# Patient Record
Sex: Male | Born: 1960 | Hispanic: Yes | Marital: Married | Smoking: Current every day smoker
Health system: Southern US, Community
[De-identification: ages and names within clinical notes are randomized; demographics above are authoritative.]

## PROBLEM LIST (undated history)

## (undated) DIAGNOSIS — E119 Type 2 diabetes mellitus without complications: Secondary | ICD-10-CM

## (undated) DIAGNOSIS — I10 Essential (primary) hypertension: Secondary | ICD-10-CM

## (undated) DIAGNOSIS — G459 Transient cerebral ischemic attack, unspecified: Secondary | ICD-10-CM

## (undated) DIAGNOSIS — I82409 Acute embolism and thrombosis of unspecified deep veins of unspecified lower extremity: Secondary | ICD-10-CM

## (undated) DIAGNOSIS — N200 Calculus of kidney: Secondary | ICD-10-CM

## (undated) DIAGNOSIS — I2510 Atherosclerotic heart disease of native coronary artery without angina pectoris: Secondary | ICD-10-CM

---

## 2016-12-13 HISTORY — PX: OTHER SURGICAL HISTORY: SHX169

## 2018-07-21 ENCOUNTER — Encounter: Payer: Self-pay | Admitting: Emergency Medicine

## 2018-07-21 ENCOUNTER — Emergency Department: Payer: Medicare Other

## 2018-07-21 ENCOUNTER — Other Ambulatory Visit: Payer: Self-pay

## 2018-07-21 ENCOUNTER — Emergency Department
Admission: EM | Admit: 2018-07-21 | Discharge: 2018-07-21 | Payer: Medicare Other | Attending: Emergency Medicine | Admitting: Emergency Medicine

## 2018-07-21 DIAGNOSIS — R0789 Other chest pain: Secondary | ICD-10-CM | POA: Insufficient documentation

## 2018-07-21 DIAGNOSIS — I10 Essential (primary) hypertension: Secondary | ICD-10-CM | POA: Diagnosis not present

## 2018-07-21 DIAGNOSIS — E119 Type 2 diabetes mellitus without complications: Secondary | ICD-10-CM | POA: Insufficient documentation

## 2018-07-21 DIAGNOSIS — Z8673 Personal history of transient ischemic attack (TIA), and cerebral infarction without residual deficits: Secondary | ICD-10-CM | POA: Insufficient documentation

## 2018-07-21 DIAGNOSIS — R079 Chest pain, unspecified: Secondary | ICD-10-CM | POA: Diagnosis present

## 2018-07-21 DIAGNOSIS — I2510 Atherosclerotic heart disease of native coronary artery without angina pectoris: Secondary | ICD-10-CM | POA: Diagnosis not present

## 2018-07-21 HISTORY — DX: Atherosclerotic heart disease of native coronary artery without angina pectoris: I25.10

## 2018-07-21 HISTORY — DX: Essential (primary) hypertension: I10

## 2018-07-21 HISTORY — DX: Transient cerebral ischemic attack, unspecified: G45.9

## 2018-07-21 HISTORY — DX: Calculus of kidney: N20.0

## 2018-07-21 HISTORY — DX: Type 2 diabetes mellitus without complications: E11.9

## 2018-07-21 LAB — BASIC METABOLIC PANEL
ANION GAP: 7 (ref 5–15)
BUN: 29 mg/dL — ABNORMAL HIGH (ref 6–20)
CALCIUM: 9.3 mg/dL (ref 8.9–10.3)
CO2: 30 mmol/L (ref 22–32)
Chloride: 105 mmol/L (ref 98–111)
Creatinine, Ser: 1.62 mg/dL — ABNORMAL HIGH (ref 0.61–1.24)
GFR, EST AFRICAN AMERICAN: 53 mL/min — AB (ref >60)
GFR, EST NON AFRICAN AMERICAN: 46 mL/min — AB (ref >60)
Glucose, Bld: 113 mg/dL — ABNORMAL HIGH (ref 70–99)
POTASSIUM: 3.5 mmol/L (ref 3.5–5.1)
Sodium: 142 mmol/L (ref 135–145)

## 2018-07-21 LAB — TROPONIN I

## 2018-07-21 LAB — CBC
HEMATOCRIT: 38.7 % — AB (ref 40.0–52.0)
HEMOGLOBIN: 13.2 g/dL (ref 13.0–18.0)
MCH: 28.6 pg (ref 26.0–34.0)
MCHC: 34.1 g/dL (ref 32.0–36.0)
MCV: 84 fL (ref 80.0–100.0)
Platelets: 217 10*3/uL (ref 150–440)
RBC: 4.61 MIL/uL (ref 4.40–5.90)
RDW: 14.1 % (ref 11.5–14.5)
WBC: 11 10*3/uL — ABNORMAL HIGH (ref 3.8–10.6)

## 2018-07-21 NOTE — Discharge Instructions (Addendum)
Please seek medical attention for any high fevers, chest pain, shortness of breath, change in behavior, persistent vomiting, bloody stool or any other new or concerning symptoms.  

## 2018-07-21 NOTE — ED Notes (Signed)
Pt is in police custody. 

## 2018-07-21 NOTE — ED Notes (Signed)
Officer Brooke DareKing with [employer] states they will be able to monitor pt for suicide at jail. Meal tray provided per pt request with consent of PD.

## 2018-07-21 NOTE — ED Notes (Signed)
Glen Peters, ed tech in to room for one on one sitter.

## 2018-07-21 NOTE — ED Notes (Signed)
This rn remains at bedside with pt due to si, awaiting si sitter. Officer king with [employer] at bedside.

## 2018-07-21 NOTE — ED Notes (Signed)
Charge rn notified that pt is suicidal. Pt states given the chance in the treatment room he will kill himself. Than, ed tech to sit one on one with pt.

## 2018-07-21 NOTE — ED Triage Notes (Signed)
Pt with central chest pain since this afternoon. Pt states has had shob, dizziness and diaphoresis. Pt with history of TIA and DVT. Pt had 324mg  asa and one ntg spray with ems.

## 2018-07-21 NOTE — ED Notes (Signed)
Derrill KayGoodman, MD now at bedside

## 2018-07-21 NOTE — ED Provider Notes (Signed)
[facility] Emergency Department Provider Note  ____________________________________________   I have reviewed the triage vital signs and the nursing notes.   HISTORY  Chief Complaint Chest Pain   History limited by: Not Limited   HPI Glen Peters is a 57 y.o. male who presents to the emergency department today because of concerns for chest pain.  He states it was pressure-like.  Located in the center chest.  Had improved at the time my exam.  Patient denies any associated radiation.  Did have some shortness of breath.  Patient denies any history of heart attacks in the past.  States he was diagnosed with a DVT and was started on Xarelto.  States he has been taking the Xarelto.  States he has a history of TIA.   Per medical record review patient has a history of DVT, TIA  Past Medical History:  Diagnosis Date  . Coronary artery disease   . Diabetes mellitus without complication (HCC)   . DVT (deep vein thrombosis) in pregnancy (HCC)   . Hypertension   . Renal calculi   . TIA (transient ischemic attack)     There are no active problems to display for this patient.   History reviewed. No pertinent surgical history.  Prior to Admission medications   Not on File    Allergies Patient has no known allergies.  History reviewed. No pertinent family history.  Social History Social History   Tobacco Use  . Smoking status: Never Smoker  . Smokeless tobacco: Never Used  Substance Use Topics  . Alcohol use: Not Currently    Frequency: Never  . Drug use: Not Currently    Review of Systems Constitutional: No fever/chills Eyes: No visual changes. ENT: No sore throat. Cardiovascular: Positive chest pain. Respiratory: Positive shortness of breath. Gastrointestinal: No abdominal pain.  No nausea, no vomiting.  No diarrhea.   Genitourinary: Negative for dysuria. Musculoskeletal: Negative for back pain. Skin: Negative for rash. Neurological:  Negative for headaches, focal weakness or numbness.  ____________________________________________   PHYSICAL EXAM:  VITAL SIGNS: ED Triage Vitals  Enc Vitals Group     BP 07/21/18 2127 (!) 210/107     Pulse Rate 07/21/18 2127 83     Resp 07/21/18 2127 18     Temp 07/21/18 2127 98.7 F (37.1 C)     Temp Source 07/21/18 2127 Oral     SpO2 07/21/18 2127 98 %     Weight 07/21/18 2128 178 lb (80.7 kg)     Height 07/21/18 2128 5\' 5"  (1.651 m)     Head Circumference --      Peak Flow --      Pain Score 07/21/18 2127 5   Constitutional: Alert and oriented.  Eyes: Conjunctivae are normal.  ENT      Head: Normocephalic and atraumatic.      Nose: No congestion/rhinnorhea.      Mouth/Throat: Mucous membranes are moist.      Neck: No stridor. Hematological/Lymphatic/Immunilogical: No cervical lymphadenopathy. Cardiovascular: Normal rate, regular rhythm.  No murmurs, rubs, or gallops.  Respiratory: Normal respiratory effort without tachypnea nor retractions. Breath sounds are clear and equal bilaterally. No wheezes/rales/rhonchi. Gastrointestinal: Soft and non tender. No rebound. No guarding.  Genitourinary: Deferred Musculoskeletal: Normal range of motion in all extremities. No lower extremity edema. Neurologic:  Normal speech and language. No gross focal neurologic deficits are appreciated.  Skin:  Skin is warm, dry and intact. No rash noted. Psychiatric: Mood and affect are normal. Speech  and behavior are normal. Patient exhibits appropriate insight and judgment.  ____________________________________________    LABS (pertinent positives/negatives)  BMP na 142, k 3.5, glu 113, cr 1.62 CBC wbc 11.0, hgb 13.2 plt 217 Trop <0.03  ____________________________________________   EKG  I, Phineas SemenGraydon Merrel Crabbe, attending physician, personally viewed and interpreted this EKG  EKG Time: 2124 Rate: 89 Rhythm: sinus rhythm with pvc Axis: normal Intervals: qtc 446 QRS: narrow, q waves  III ST changes: no st elevation Impression: abnormal ekg   ____________________________________________    RADIOLOGY  CXR No active disease  ____________________________________________   PROCEDURES  Procedures  ____________________________________________   INITIAL IMPRESSION / ASSESSMENT AND PLAN / ED COURSE  Pertinent labs & imaging results that were available during my care of the patient were reviewed by me and considered in my medical decision making (see chart for details).   Patient scented to the emergency department today because of concerns for chest pain.  EKG without any concerning ST elevation.  Given slightly suspicious history, age and risk factors patient has a heart score of 3.  Troponin was negative.  Will plan on discharging patient.  ____________________________________________   FINAL CLINICAL IMPRESSION(S) / ED DIAGNOSES  Final diagnoses:  Nonspecific chest pain     Note: This dictation was prepared with Dragon dictation. Any transcriptional errors that result from this process are unintentional     Phineas SemenGoodman, Sheila Gervasi, MD 07/21/18 2342

## 2018-08-29 ENCOUNTER — Inpatient Hospital Stay
Admission: EM | Admit: 2018-08-29 | Discharge: 2018-09-01 | DRG: 617 | Payer: Medicare Other | Attending: Internal Medicine | Admitting: Internal Medicine

## 2018-08-29 ENCOUNTER — Emergency Department: Payer: Medicare Other

## 2018-08-29 ENCOUNTER — Other Ambulatory Visit: Payer: Self-pay

## 2018-08-29 ENCOUNTER — Encounter: Payer: Self-pay | Admitting: Emergency Medicine

## 2018-08-29 DIAGNOSIS — I96 Gangrene, not elsewhere classified: Secondary | ICD-10-CM | POA: Diagnosis present

## 2018-08-29 DIAGNOSIS — Z79899 Other long term (current) drug therapy: Secondary | ICD-10-CM

## 2018-08-29 DIAGNOSIS — F329 Major depressive disorder, single episode, unspecified: Secondary | ICD-10-CM | POA: Diagnosis present

## 2018-08-29 DIAGNOSIS — I70262 Atherosclerosis of native arteries of extremities with gangrene, left leg: Secondary | ICD-10-CM | POA: Diagnosis not present

## 2018-08-29 DIAGNOSIS — Z833 Family history of diabetes mellitus: Secondary | ICD-10-CM

## 2018-08-29 DIAGNOSIS — F17210 Nicotine dependence, cigarettes, uncomplicated: Secondary | ICD-10-CM | POA: Diagnosis present

## 2018-08-29 DIAGNOSIS — Z7902 Long term (current) use of antithrombotics/antiplatelets: Secondary | ICD-10-CM | POA: Diagnosis not present

## 2018-08-29 DIAGNOSIS — I2510 Atherosclerotic heart disease of native coronary artery without angina pectoris: Secondary | ICD-10-CM | POA: Diagnosis present

## 2018-08-29 DIAGNOSIS — E1140 Type 2 diabetes mellitus with diabetic neuropathy, unspecified: Secondary | ICD-10-CM | POA: Diagnosis present

## 2018-08-29 DIAGNOSIS — K219 Gastro-esophageal reflux disease without esophagitis: Secondary | ICD-10-CM | POA: Diagnosis present

## 2018-08-29 DIAGNOSIS — I10 Essential (primary) hypertension: Secondary | ICD-10-CM | POA: Diagnosis present

## 2018-08-29 DIAGNOSIS — Z86718 Personal history of other venous thrombosis and embolism: Secondary | ICD-10-CM | POA: Diagnosis not present

## 2018-08-29 DIAGNOSIS — Z8249 Family history of ischemic heart disease and other diseases of the circulatory system: Secondary | ICD-10-CM | POA: Diagnosis not present

## 2018-08-29 DIAGNOSIS — Z7984 Long term (current) use of oral hypoglycemic drugs: Secondary | ICD-10-CM | POA: Diagnosis not present

## 2018-08-29 DIAGNOSIS — Z8673 Personal history of transient ischemic attack (TIA), and cerebral infarction without residual deficits: Secondary | ICD-10-CM | POA: Diagnosis not present

## 2018-08-29 DIAGNOSIS — Z87442 Personal history of urinary calculi: Secondary | ICD-10-CM | POA: Diagnosis not present

## 2018-08-29 DIAGNOSIS — G4730 Sleep apnea, unspecified: Secondary | ICD-10-CM | POA: Diagnosis present

## 2018-08-29 DIAGNOSIS — E1169 Type 2 diabetes mellitus with other specified complication: Secondary | ICD-10-CM | POA: Diagnosis present

## 2018-08-29 DIAGNOSIS — M86171 Other acute osteomyelitis, right ankle and foot: Secondary | ICD-10-CM | POA: Diagnosis present

## 2018-08-29 DIAGNOSIS — Z23 Encounter for immunization: Secondary | ICD-10-CM

## 2018-08-29 DIAGNOSIS — E1152 Type 2 diabetes mellitus with diabetic peripheral angiopathy with gangrene: Secondary | ICD-10-CM | POA: Diagnosis present

## 2018-08-29 DIAGNOSIS — O2230 Deep phlebothrombosis in pregnancy, unspecified trimester: Secondary | ICD-10-CM | POA: Insufficient documentation

## 2018-08-29 DIAGNOSIS — E119 Type 2 diabetes mellitus without complications: Secondary | ICD-10-CM | POA: Diagnosis not present

## 2018-08-29 HISTORY — DX: Acute embolism and thrombosis of unspecified deep veins of unspecified lower extremity: I82.409

## 2018-08-29 LAB — CBC WITH DIFFERENTIAL/PLATELET
BASOS ABS: 0 10*3/uL (ref 0–0.1)
BASOS PCT: 1 %
EOS ABS: 0.1 10*3/uL (ref 0–0.7)
Eosinophils Relative: 1 %
HEMATOCRIT: 33.5 % — AB (ref 40.0–52.0)
HEMOGLOBIN: 11.9 g/dL — AB (ref 13.0–18.0)
Lymphocytes Relative: 18 %
Lymphs Abs: 1.5 10*3/uL (ref 1.0–3.6)
MCH: 29.1 pg (ref 26.0–34.0)
MCHC: 35.4 g/dL (ref 32.0–36.0)
MCV: 82.3 fL (ref 80.0–100.0)
MONO ABS: 0.9 10*3/uL (ref 0.2–1.0)
Monocytes Relative: 10 %
NEUTROS ABS: 5.9 10*3/uL (ref 1.4–6.5)
NEUTROS PCT: 70 %
Platelets: 257 10*3/uL (ref 150–440)
RBC: 4.07 MIL/uL — ABNORMAL LOW (ref 4.40–5.90)
RDW: 13.9 % (ref 11.5–14.5)
WBC: 8.4 10*3/uL (ref 3.8–10.6)

## 2018-08-29 LAB — BASIC METABOLIC PANEL
ANION GAP: 8 (ref 5–15)
BUN: 33 mg/dL — ABNORMAL HIGH (ref 6–20)
CALCIUM: 9 mg/dL (ref 8.9–10.3)
CO2: 30 mmol/L (ref 22–32)
CREATININE: 1.43 mg/dL — AB (ref 0.61–1.24)
Chloride: 102 mmol/L (ref 98–111)
GFR calc non Af Amer: 53 mL/min — ABNORMAL LOW (ref >60)
Glucose, Bld: 92 mg/dL (ref 70–99)
Potassium: 4 mmol/L (ref 3.5–5.1)
SODIUM: 140 mmol/L (ref 135–145)

## 2018-08-29 LAB — GLUCOSE, CAPILLARY
GLUCOSE-CAPILLARY: 200 mg/dL — AB (ref 70–99)
GLUCOSE-CAPILLARY: 117 mg/dL — AB (ref 70–99)

## 2018-08-29 LAB — MRSA PCR SCREENING: MRSA by PCR: NEGATIVE

## 2018-08-29 MED ORDER — CARVEDILOL 12.5 MG PO TABS
12.5000 mg | ORAL_TABLET | Freq: Two times a day (BID) | ORAL | Status: DC
  Administered 2018-08-29 – 2018-09-01 (×6): 12.5 mg via ORAL
  Filled 2018-08-29 (×7): qty 1

## 2018-08-29 MED ORDER — VANCOMYCIN HCL IN DEXTROSE 1-5 GM/200ML-% IV SOLN
1000.0000 mg | Freq: Once | INTRAVENOUS | Status: AC
  Administered 2018-08-29: 1000 mg via INTRAVENOUS
  Filled 2018-08-29: qty 200

## 2018-08-29 MED ORDER — ONDANSETRON HCL 4 MG/2ML IJ SOLN: 4.0000 mg | Freq: Four times a day (QID) | INTRAMUSCULAR | Status: DC | PRN

## 2018-08-29 MED ORDER — PIPERACILLIN-TAZOBACTAM 3.375 G IVPB 30 MIN
3.3750 g | Freq: Once | INTRAVENOUS | Status: AC
  Administered 2018-08-29: 3.375 g via INTRAVENOUS
  Filled 2018-08-29: qty 50

## 2018-08-29 MED ORDER — ENOXAPARIN SODIUM 40 MG/0.4ML ~~LOC~~ SOLN
40.0000 mg | SUBCUTANEOUS | Status: DC
  Administered 2018-08-29 – 2018-08-31 (×3): 40 mg via SUBCUTANEOUS
  Filled 2018-08-29 (×3): qty 0.4

## 2018-08-29 MED ORDER — LOSARTAN POTASSIUM-HCTZ 100-25 MG PO TABS: 1.0000 | ORAL_TABLET | Freq: Every day | ORAL | Status: DC

## 2018-08-29 MED ORDER — VANCOMYCIN 50 MG/ML ORAL SOLUTION
125.0000 mg | Freq: Four times a day (QID) | ORAL | Status: DC
  Filled 2018-08-29: qty 2.5

## 2018-08-29 MED ORDER — PNEUMOCOCCAL VAC POLYVALENT 25 MCG/0.5ML IJ INJ
0.5000 mL | INJECTION | INTRAMUSCULAR | Status: AC
  Administered 2018-08-30: 0.5 mL via INTRAMUSCULAR
  Filled 2018-08-29: qty 0.5

## 2018-08-29 MED ORDER — AMLODIPINE BESYLATE 10 MG PO TABS
10.0000 mg | ORAL_TABLET | Freq: Every day | ORAL | Status: DC
  Administered 2018-08-29 – 2018-09-01 (×4): 10 mg via ORAL
  Filled 2018-08-29 (×4): qty 1

## 2018-08-29 MED ORDER — ACETAMINOPHEN 650 MG RE SUPP: 650.0000 mg | Freq: Four times a day (QID) | RECTAL | Status: DC | PRN

## 2018-08-29 MED ORDER — INSULIN ASPART 100 UNIT/ML ~~LOC~~ SOLN: 0.0000 [IU] | Freq: Every day | SUBCUTANEOUS | Status: DC

## 2018-08-29 MED ORDER — ONDANSETRON HCL 4 MG PO TABS: 4.0000 mg | ORAL_TABLET | Freq: Four times a day (QID) | ORAL | Status: DC | PRN

## 2018-08-29 MED ORDER — LOSARTAN POTASSIUM 50 MG PO TABS
100.0000 mg | ORAL_TABLET | Freq: Every day | ORAL | Status: DC
  Administered 2018-08-29 – 2018-09-01 (×4): 100 mg via ORAL
  Filled 2018-08-29 (×4): qty 2

## 2018-08-29 MED ORDER — PANTOPRAZOLE SODIUM 40 MG PO TBEC
40.0000 mg | DELAYED_RELEASE_TABLET | Freq: Every day | ORAL | Status: DC
  Administered 2018-08-29 – 2018-09-01 (×4): 40 mg via ORAL
  Filled 2018-08-29 (×4): qty 1

## 2018-08-29 MED ORDER — SERTRALINE HCL 50 MG PO TABS
150.0000 mg | ORAL_TABLET | Freq: Every day | ORAL | Status: DC
  Administered 2018-08-29: 150 mg via ORAL
  Filled 2018-08-29 (×2): qty 3

## 2018-08-29 MED ORDER — INSULIN ASPART 100 UNIT/ML ~~LOC~~ SOLN
0.0000 [IU] | Freq: Three times a day (TID) | SUBCUTANEOUS | Status: DC
  Administered 2018-08-29: 2 [IU] via SUBCUTANEOUS
  Administered 2018-08-30 (×2): 1 [IU] via SUBCUTANEOUS
  Administered 2018-08-30: 2 [IU] via SUBCUTANEOUS
  Administered 2018-08-31: 1 [IU] via SUBCUTANEOUS
  Administered 2018-09-01: 2 [IU] via SUBCUTANEOUS
  Filled 2018-08-29 (×6): qty 1

## 2018-08-29 MED ORDER — PIPERACILLIN-TAZOBACTAM 3.375 G IVPB
3.3750 g | Freq: Three times a day (TID) | INTRAVENOUS | Status: DC
  Administered 2018-08-29 – 2018-09-01 (×8): 3.375 g via INTRAVENOUS
  Filled 2018-08-29 (×7): qty 50

## 2018-08-29 MED ORDER — INFLUENZA VAC SPLIT QUAD 0.5 ML IM SUSY
0.5000 mL | PREFILLED_SYRINGE | INTRAMUSCULAR | Status: AC
  Administered 2018-08-30: 0.5 mL via INTRAMUSCULAR
  Filled 2018-08-29: qty 0.5

## 2018-08-29 MED ORDER — ACETAMINOPHEN 325 MG PO TABS
650.0000 mg | ORAL_TABLET | Freq: Four times a day (QID) | ORAL | Status: DC | PRN
  Administered 2018-08-29 – 2018-08-31 (×5): 650 mg via ORAL
  Filled 2018-08-29 (×5): qty 2

## 2018-08-29 MED ORDER — HYDROCHLOROTHIAZIDE 25 MG PO TABS
25.0000 mg | ORAL_TABLET | Freq: Every day | ORAL | Status: DC
  Administered 2018-08-29 – 2018-09-01 (×4): 25 mg via ORAL
  Filled 2018-08-29 (×4): qty 1

## 2018-08-29 MED ORDER — GLIPIZIDE 5 MG PO TABS
5.0000 mg | ORAL_TABLET | Freq: Two times a day (BID) | ORAL | Status: DC
  Administered 2018-08-30 – 2018-09-01 (×5): 5 mg via ORAL
  Filled 2018-08-29 (×7): qty 1

## 2018-08-29 MED ORDER — VANCOMYCIN HCL 10 G IV SOLR
1250.0000 mg | INTRAVENOUS | Status: DC
  Administered 2018-08-29 – 2018-09-01 (×4): 1250 mg via INTRAVENOUS
  Filled 2018-08-29 (×5): qty 1250

## 2018-08-29 NOTE — Progress Notes (Signed)
Rounding on ED Pt was referred by clerk. Pt is in custody of [employer]. Chaplain explained to Pt that he will have to get the AD done through the procedures of the Correctional system. AD has staples in it therefore it was presented to the sheriff who was present. We need to get clarification as to the protocol for Chaplains for Pt in custody.    08/29/18 1100  Clinical Encounter Type  Visited With Patient  Visit Type Initial  Referral From Nurse  Spiritual Encounters  Spiritual Needs Brochure

## 2018-08-29 NOTE — ED Notes (Signed)
Toe dressed per MD request. Pt tolerated well. Pt aware of POC and denies needs/complaints. Officer at bedside.

## 2018-08-29 NOTE — ED Notes (Signed)
XR in room 

## 2018-08-29 NOTE — ED Notes (Signed)
Pt in US. Officer accompanied pt to US.

## 2018-08-29 NOTE — Progress Notes (Signed)
Pt arrived to room 157 from ED. Pt alert and orientedX4. Pt denies pain at this time. Pt ambulatory to the bathroom. Officer at bedside. MRSA swab obtained. Call bell within reach. No other questions or complaints at this time.

## 2018-08-29 NOTE — ED Notes (Signed)
Returned from US, [place] in room to discuss creating advanced directive.

## 2018-08-29 NOTE — H&P (Signed)
[employer] -  at [facility]   PATIENT NAME: Glen Peters    MR#:  [id number]  DATE OF BIRTH:  08/15/1961  DATE OF ADMISSION:  08/29/2018  [place] [facility]   REQUESTING/REFERRING PHYSICIAN: Dr. Governor Rooksebecca Lord  CHIEF COMPLAINT:   Chief Complaint  Patient presents with  . Toe Injury  . Toe Pain  . Wound Infection    HISTORY OF PRESENT ILLNESS:  Glen Peters  is a 57 y.o. male with a known history of diabetes, coronary artery disease, previous history of DVT, renal stones, hypertension, previous history of TIAs, history of severe obstructive sleep apnea status post hypoglossal nerve stimulator who presents to the hospital due to a toe that appears necrotic and has some foul-smelling drainage.  Patient apparently is a inmate [facility] and over the past few days to a week he has noticed that his right third toe has been looking different and has been bleeding intermittently with some foul smelling odor.  He was brought to the ER for further evaluation noted to have a necrotic right third toe.  Hospitalist services were contacted for admission.  Patient denies any significant pain to the toe, he denies any fevers chills nausea vomiting or any other associated symptoms presently.  Patient underwent x-ray of his right foot which shows no evidence of osteomyelitis other than some soft tissue inflammation.  PAST MEDICAL HISTORY:   Past Medical History:  Diagnosis Date  . Coronary artery disease   . Diabetes mellitus without complication (HCC)   . DVT (deep venous thrombosis) (HCC)   . Hypertension   . Renal calculi   . TIA (transient ischemic attack)     PAST SURGICAL HISTORY:  History reviewed. No pertinent surgical history.  SOCIAL HISTORY:   Social History   Tobacco Use  . Smoking status: Current Every Day Smoker    Packs/day: 1.00    Years: 5.00    Pack years: 5.00    Types: Cigarettes  . Smokeless  tobacco: Never Used  Substance Use Topics  . Alcohol use: Yes    Frequency: Never    Comment: Socially    FAMILY HISTORY:   Family History  Problem Relation Age of Onset  . Diabetes Mother   . Hypertension Mother   . Diabetes Father   . Hypertension Father     DRUG ALLERGIES:  No Known Allergies  REVIEW OF SYSTEMS:   Review of Systems  Constitutional: Negative for fever and weight loss.  HENT: Negative for congestion, nosebleeds and tinnitus.   Eyes: Negative for blurred vision, double vision and redness.  Respiratory: Negative for cough, hemoptysis and shortness of breath.   Cardiovascular: Negative for chest pain, orthopnea, leg swelling and PND.  Gastrointestinal: Negative for abdominal pain, diarrhea, melena, nausea and vomiting.  Genitourinary: Negative for dysuria, hematuria and urgency.  Musculoskeletal: Negative for falls and joint pain.  Neurological: Negative for dizziness, tingling, sensory change, focal weakness, seizures, weakness and headaches.  Endo/Heme/Allergies: Negative for polydipsia. Does not bruise/bleed easily.  Psychiatric/Behavioral: Negative for depression and memory loss. The patient is not nervous/anxious.     MEDICATIONS AT HOME:   Prior to Admission medications   Medication Sig Start Date End Date Taking? Authorizing Provider  acetaminophen (TYLENOL) 500 MG tablet Take 1,000 mg by mouth 2 (two) times daily. For 7 days 08/25/18 08/31/18 Yes [provider]  amLODipine (NORVASC) 10 MG tablet Take 10 mg by mouth daily.   Yes [provider]  carvedilol (COREG) 12.5 MG tablet Take 12.5 mg by mouth 2 (two) times daily.   Yes [provider]  ciprofloxacin (CIPRO) 500 MG tablet Take 500 mg by mouth 2 (two) times daily. For 20 days 08/13/18 09/01/18 Yes [provider]  clopidogrel (PLAVIX) 75 MG tablet Take 75 mg by mouth daily.   Yes [provider]  glipiZIDE (GLUCOTROL) 5 MG tablet Take 5 mg by mouth 2  (two) times daily.   Yes [provider]  losartan-hydrochlorothiazide (HYZAAR) 100-25 MG tablet Take 1 tablet by mouth daily.   Yes [provider]  omeprazole (PRILOSEC) 20 MG capsule Take 20 mg by mouth daily.   Yes [provider]  sertraline (ZOLOFT) 50 MG tablet Take 150 mg by mouth daily.   Yes [provider]      VITAL SIGNS:  Blood pressure 125/74, pulse 75, temperature 98.5 F (36.9 C), temperature source Oral, resp. rate 15, height 5\' 5"  (1.651 m), weight 76.2 kg, SpO2 98 %.  PHYSICAL EXAMINATION:  Physical Exam  GENERAL:  57 y.o.-year-old patient lying in the bed with no acute distress.  EYES: Pupils equal, round, reactive to light and accommodation. No scleral icterus. Extraocular muscles intact.  HEENT: Head atraumatic, normocephalic. Oropharynx and nasopharynx clear. No oropharyngeal erythema, moist oral mucosa  NECK:  Supple, no jugular venous distention. No thyroid enlargement, no tenderness.  LUNGS: Normal breath sounds bilaterally, no wheezing, rales, rhonchi. No use of accessory muscles of respiration.  CARDIOVASCULAR: S1, S2 RRR. No murmurs, rubs, gallops, clicks.  ABDOMEN: Soft, nontender, nondistended. Bowel sounds present. No organomegaly or mass.  EXTREMITIES: No pedal edema, cyanosis, or clubbing. + 2 pedal & radial pulses b/l.   NEUROLOGIC: Cranial nerves II through XII are intact. No focal Motor or sensory deficits appreciated b/l PSYCHIATRIC: The patient is alert and oriented x 3. Good affect.  SKIN: No obvious rash, lesion, or ulcer.  Necrotic right third toe.  LABORATORY PANEL:   CBC Recent Labs  Lab 08/29/18 1112  WBC 8.4  HGB 11.9*  HCT 33.5*  PLT 257   ------------------------------------------------------------------------------------------------------------------  Chemistries  Recent Labs  Lab 08/29/18 1112  NA 140  K 4.0  CL 102  CO2 30  GLUCOSE 92  BUN 33*  CREATININE 1.43*  CALCIUM 9.0    ------------------------------------------------------------------------------------------------------------------  Cardiac Enzymes No results for input(s): TROPONINI in the last 168 hours. ------------------------------------------------------------------------------------------------------------------  RADIOLOGY:  US Venous Img Lower Unilateral Right  Result Date: 08/29/2018 CLINICAL DATA:  57 year old male with right toe wound for the past 2 weeks. Evaluate for underlying DVT. EXAM: RIGHT LOWER EXTREMITY VENOUS DOPPLER ULTRASOUND TECHNIQUE: Gray-scale sonography with graded compression, as well as color Doppler and duplex ultrasound were performed to evaluate the lower extremity deep venous systems from the level of the common femoral vein and including the common femoral, femoral, profunda femoral, popliteal and calf veins including the posterior tibial, peroneal and gastrocnemius veins when visible. The superficial great saphenous vein was also interrogated. Spectral Doppler was utilized to evaluate flow at rest and with distal augmentation maneuvers in the common femoral, femoral and popliteal veins. COMPARISON:  None. FINDINGS: Contralateral Common Femoral Vein: Respiratory phasicity is normal and symmetric with the symptomatic side. No evidence of thrombus. Normal compressibility. Common Femoral Vein: No evidence of thrombus. Normal compressibility, respiratory phasicity and response to augmentation. Saphenofemoral Junction: No evidence of thrombus. Normal compressibility and flow on color Doppler imaging. Profunda Femoral Vein: No evidence of thrombus. Normal compressibility and flow  on color Doppler imaging. Femoral Vein: No evidence of thrombus. Normal compressibility, respiratory phasicity and response to augmentation. Popliteal Vein: No evidence of thrombus. Normal compressibility, respiratory phasicity and response to augmentation. Calf Veins: No evidence of thrombus. Normal  compressibility and flow on color Doppler imaging. Superficial Great Saphenous Vein: No evidence of thrombus. Normal compressibility. Venous Reflux:  None. Other Findings:  None. IMPRESSION: No evidence of deep venous thrombosis. Electronically Signed   By: Malachy Moan M.D.   On: 08/29/2018 11:13   Dg Toe 3rd Right  Result Date: 08/29/2018 CLINICAL DATA:  Injured third toe of the right foot. EXAM: RIGHT THIRD TOE COMPARISON:  None. FINDINGS: There is no evidence of fracture or dislocation. There is no evidence of arthropathy or other focal bone abnormality. There is a soft tissue wound of the third digit. There is no periosteal reaction or bone destruction. IMPRESSION: Soft tissue wound of the third digit. No radiographic evidence of osteomyelitis of the third toe. Electronically Signed   By: Elige Ko   On: 08/29/2018 11:00     IMPRESSION AND PLAN:   57 year old male with past medical history of coronary artery disease, previous TIA, diabetes, hypertension, history of severe obstructive sleep apnea status post hypoglossal nerve stimulator, who presents to the hospital due to a necrotic appearing right third toe with some foul-smelling drainage.  1.  Necrotic right third toe- patient's x-ray shows no evidence of we will admit the patient to the hospital start him on broad-spectrum IV antibiotics vancomycin, Zosyn.  We will get a podiatry consult, discussed the case with Dr. Orland Jarred.  2.  Diabetes type 2 with neuropathy- continue glipizide, will order some sliding scale insulin. -Check hemoglobin A 1C.  3.  Essential hypertension-continue carvedilol, Norvasc, losartan/HCTZ.  4.  GERD-continue Protonix.  5.  Depression-continue Zoloft.    All the records are reviewed and case discussed with ED provider. Management plans discussed with the patient, family and they are in agreement.  CODE STATUS: Full code  TOTAL TIME TAKING CARE OF THIS PATIENT: 40 minutes.    Houston Siren  M.D on 08/29/2018 at 1:17 PM  Between 7am to 6pm - Pager - [phone]  After 6pm go to [email or web address] - password [email or web address] [facility]  [facility]  Office  [phone]  CC: Primary care physician; [place]

## 2018-08-29 NOTE — Progress Notes (Addendum)
Pharmacy Antibiotic Note  Glen SmolderJose Peters is a 57 y.o. male admitted on 08/29/2018 with wound infection.  Pharmacy has been consulted for vancomycin and Zosyn dosing. Pt received Zosyn 3.375 g IV x1 and vancomycin 1g IV x1 in the ED.   Plan: Zosyn 3.375g IV q8h (4 hour infusion).   Vancomycin 1250 mg IV q18h with stacked dosing. Will need to follow renal function closely. Vancomycin trough before 4th dose of regimen.   Ke 0.046, half life 15 h, Vd 53.3 Peters  Height: 5\' 5"  (165.1 cm) Weight: 168 lb (76.2 kg) IBW/kg (Calculated) : 61.5  Temp (24hrs), Avg:98.5 F (36.9 C), Min:98.5 F (36.9 C), Max:98.5 F (36.9 C)  Recent Labs  Lab 08/29/18 1112  WBC 8.4  CREATININE 1.43*    Estimated Creatinine Clearance: 55 mL/min (A) (by C-G formula based on SCr of 1.43 mg/dL (H)).    No Known Allergies  Antimicrobials this admission: Vanc/Zosyn 9/17>>  Dose adjustments this admission:   Microbiology results:    Thank you for allowing pharmacy to be a part of this patient's care.  Glen Peters, Glen Peters 08/29/2018 3:29 PM

## 2018-08-29 NOTE — ED Notes (Signed)
ED Provider at bedside. 

## 2018-08-29 NOTE — ED Provider Notes (Signed)
[facility] Emergency Department Provider Note ____________________________________________   I have reviewed the triage vital signs and the triage nursing note.  HISTORY  Chief Complaint Toe Injury; Toe Pain; and Wound Infection   Historian Patient With [place], from jail  HPI Glen Peters is a 57 y.o. male presenting from jail with complaint of toe infection.  He has a black third toe, for it sounds like over a week.  Reports some swelling to the entire right lower extremity although it is not clear how long that is been there.   He had a history in the past of a DVT in the right lower extremity that he took 6 months of Xarelto, and is currently off of that now.  Mild pain.  States that he had some nausea and vomiting over a week ago, that is better.  States that he has night sweats.    Past Medical History:  Diagnosis Date  . Coronary artery disease   . Diabetes mellitus without complication (HCC)   . DVT (deep venous thrombosis) (HCC)   . Hypertension   . Renal calculi   . TIA (transient ischemic attack)     Patient Active Problem List   Diagnosis Date Noted  . Necrotic toes (HCC) 08/29/2018  . DVT (deep vein thrombosis) in pregnancy [facility])     History reviewed. No pertinent surgical history.  Prior to Admission medications   Medication Sig Start Date End Date Taking? Authorizing Provider  acetaminophen (TYLENOL) 500 MG tablet Take 1,000 mg by mouth 2 (two) times daily. For 7 days 08/25/18 08/31/18 Yes [provider]  amLODipine (NORVASC) 10 MG tablet Take 10 mg by mouth daily.   Yes [provider]  carvedilol (COREG) 12.5 MG tablet Take 12.5 mg by mouth 2 (two) times daily.   Yes [provider]  ciprofloxacin (CIPRO) 500 MG tablet Take 500 mg by mouth 2 (two) times daily. For 20 days 08/13/18 09/01/18 Yes [provider]  clopidogrel (PLAVIX) 75 MG tablet Take 75 mg by mouth daily.   Yes [provider]  glipiZIDE (GLUCOTROL) 5 MG tablet Take 5 mg by mouth 2 (two) times daily.   Yes [provider]  losartan-hydrochlorothiazide (HYZAAR) 100-25 MG tablet Take 1 tablet by mouth daily.   Yes [provider]  omeprazole (PRILOSEC) 20 MG capsule Take 20 mg by mouth daily.   Yes [provider]  sertraline (ZOLOFT) 50 MG tablet Take 150 mg by mouth daily.   Yes [provider]    No Known Allergies  Family History  Problem Relation Age of Onset  . Diabetes Mother   . Hypertension Mother   . Diabetes Father   . Hypertension Father     Social History Social History   Tobacco Use  . Smoking status: Current Every Day Smoker    Packs/day: 1.00    Years: 5.00    Pack years: 5.00    Types: Cigarettes  . Smokeless tobacco: Never Used  Substance Use Topics  . Alcohol use: Yes    Frequency: Never    Comment: Socially  . Drug use: Not Currently    Review of Systems  Constitutional: Negative for fever. Eyes: Negative for visual changes. ENT: Negative for sore throat. Cardiovascular: Negative for chest pain. Respiratory: Negative for shortness of breath. Gastrointestinal: Negative for abdominal pain, vomiting and diarrhea. Genitourinary: Negative for dysuria. Musculoskeletal: Negative for back pain. Skin: Necrotic right third toe. Neurological: Negative for headache.  ____________________________________________  PHYSICAL EXAM:  VITAL SIGNS: ED Triage Vitals  Enc Vitals Group     BP 08/29/18 0959 129/76     Pulse Rate 08/29/18 0959 77     Resp 08/29/18 0959 20     Temp 08/29/18 0959 98.5 F (36.9 C)     Temp Source 08/29/18 0959 Oral     SpO2 08/29/18 0959 98 %     Weight 08/29/18 1000 168 lb (76.2 kg)     Height 08/29/18 1000 5\' 5"  (1.651 m)     Head Circumference --      Peak Flow --      Pain Score 08/29/18 1000 9     Pain Loc --      Pain Edu? --      Excl. in GC? --      Constitutional: Alert and oriented.   HEENT      Head: Normocephalic and atraumatic.      Eyes: Conjunctivae are normal. Pupils equal and round.       Ears:         Nose: No congestion/rhinnorhea.      Mouth/Throat: Mucous membranes are moist.      Neck: No stridor. Cardiovascular/Chest: Normal rate, regular rhythm.  No murmurs, rubs, or gallops. Respiratory: Normal respiratory effort without tachypnea nor retractions. Breath sounds are clear and equal bilaterally. No wheezes/rales/rhonchi. Gastrointestinal: Soft. No distention, no guarding, no rebound. Nontender.    Genitourinary/rectal:Deferred Musculoskeletal: Nontender with normal range of motion in all extremities. No joint effusions.  Right lower extremity 1+ pitting edema.  Left lower extremity no edema.  Right third toe with an area of necrosis as well as an area of white drainage and erythema.  This area does not seem to extend past the toe onto the foot. Neurologic:  Normal speech and language. No gross or focal neurologic deficits are appreciated. Skin:  Skin is warm, dry and intact. No rash noted. Psychiatric: Mood and affect are normal. Speech and behavior are normal. Patient exhibits appropriate insight and judgment.   ____________________________________________  LABS (pertinent positives/negatives) I, Governor Rooks, MD the attending physician have reviewed the labs noted below.  Labs Reviewed  CBC WITH DIFFERENTIAL/PLATELET - Abnormal; Notable for the following components:      Result Value   RBC 4.07 (*)    Hemoglobin 11.9 (*)    HCT 33.5 (*)    All other components within normal limits  BASIC METABOLIC PANEL - Abnormal; Notable for the following components:   BUN 33 (*)    Creatinine, Ser 1.43 (*)    GFR calc non Af Amer 53 (*)    All other components within normal limits  HEMOGLOBIN A1C    ____________________________________________    EKG I, Governor Rooks, MD, the attending physician have personally viewed and interpreted all  ECGs.  None ____________________________________________  RADIOLOGY   X-ray right third toe viewed by me and I reviewed the radiologist report: No underlying fracture or signs of osteo-myelitis  Ultrasound right lower extremity reviewed radiology report: No DVT __________________________________________  PROCEDURES  Procedure(s) performed: None  Procedures  Critical Care performed: None   ____________________________________________  ED COURSE / ASSESSMENT AND PLAN  Pertinent labs & imaging results that were available during my care of the patient were reviewed by me and considered in my medical decision making (see chart for details).     Patient has a gangrenous toe.  He is a diabetic so likely vascular risk factor there.  Given swelling to the  right lower extremity, ultrasound was obtained to rule out DVT and there is no DVT.  Patient does not appear to be septic clinically, but will send labs for eval of electrolytes and kidney function and white blood cell count.  X-ray does not show signs of underlying osteomyelitis, however there is a necrotic area of skin with a surrounding area that looks infected and gangrenous, patient will need likely surgical debridement.  Will initiate IV antibiotics.  Patient given Zosyn and vancomycin.  We will admit to the hospitalist.    CONSULTATIONS:   Hospitalist for admission.   Patient / Family / Caregiver informed of clinical course, medical decision-making process, and agree with plan.    ___________________________________________   FINAL CLINICAL IMPRESSION(S) / ED DIAGNOSES   Final diagnoses:  Gangrene of toe (HCC)      ___________________________________________         Note: This dictation was prepared with Dragon dictation. Any transcriptional errors that result from this process are unintentional    Governor RooksLord, Azariah Bonura, MD 08/29/18 1336

## 2018-08-29 NOTE — ED Notes (Addendum)
Pt reports problem with toe (right middle toe) for the past 2 weeks, pt has hx of diabetes and PAD. Pt states he is not on insulin currently while in the jail. Pt states he also has neuropathy and is unable to feel if any problems in his feet.   Necrotic area noted to middle toe on the right, no drainage noted at this time. Officer at bedside. Pt in bilateral wrist and ankle shackles.

## 2018-08-29 NOTE — Consult Note (Signed)
[facility] Podiatry                                                      Patient Demographics  Glen Peters, is a 57 y.o. male   MRN: [id number]   DOB - [birth year]  Admit Date - 08/29/2018    Outpatient Primary MD for the patient is Kendall West, [place], MD  Consult requested in the Hospital by Houston Siren, MD, On 08/29/2018    Reason for consult patient admitted for necrotic changes third toe right foot.  He is a incarcerated individual at the [facility] jail and states that the toe has gotten worse over the last week.   With History of -  Past Medical History:  Diagnosis Date  . Coronary artery disease   . Diabetes mellitus without complication (HCC)   . DVT (deep venous thrombosis) (HCC)   . Hypertension   . Renal calculi   . TIA (transient ischemic attack)       History reviewed. No pertinent surgical history.  in for   Chief Complaint  Patient presents with  . Toe Injury  . Toe Pain  . Wound Infection     HPI  Glen Peters  is a 57 y.o. male, patient is diabetic states he had an angioplasty done in April this past year in [place].  I asked him if they did any stents he said no that they just opened up his arteries to his right side.  States his foot and leg feel better since he did that.    Review of Systems    In addition to the HPI above,  No Fever-chills, No Headache, No changes with Vision or hearing, No problems swallowing food or Liquids, No Chest pain, Cough or Shortness of Breath, No Abdominal pain, No Nausea or Vommitting, Bowel movements are regular, No Blood in stool or Urine, No dysuria, No new skin rashes or bruises, No new joints pains-aches,  No new weakness, tingling, numbness in any extremity, No recent weight gain or loss, No polyuria, polydypsia or polyphagia, No significant Mental  Stressors.  A full 10 point Review of Systems was done, except as stated above, all other Review of Systems were negative.   Social History Social History   Tobacco Use  . Smoking status: Current Every Day Smoker    Packs/day: 1.00    Years: 5.00    Pack years: 5.00    Types: Cigarettes  . Smokeless tobacco: Never Used  Substance Use Topics  . Alcohol use: Yes    Frequency: Never    Comment: Socially    Family History Family History  Problem Relation Age of Onset  . Diabetes Mother   . Hypertension Mother   . Diabetes Father   . Hypertension Father     Prior to Admission medications   Medication Sig Start Date End Date Taking? Authorizing Provider  acetaminophen (TYLENOL) 500 MG tablet Take 1,000 mg by mouth 2 (two) times daily. For 7 days 08/25/18 08/31/18 Yes [provider]  amLODipine (NORVASC) 10 MG tablet Take 10 mg by mouth daily.   Yes [provider]  carvedilol (COREG) 12.5 MG tablet Take 12.5 mg by mouth 2 (two) times daily.   Yes [provider]  ciprofloxacin (CIPRO) 500 MG tablet Take 500 mg by  mouth 2 (two) times daily. For 20 days 08/13/18 09/01/18 Yes [provider]  clopidogrel (PLAVIX) 75 MG tablet Take 75 mg by mouth daily.   Yes [provider]  glipiZIDE (GLUCOTROL) 5 MG tablet Take 5 mg by mouth 2 (two) times daily.   Yes [provider]  losartan-hydrochlorothiazide (HYZAAR) 100-25 MG tablet Take 1 tablet by mouth daily.   Yes [provider]  omeprazole (PRILOSEC) 20 MG capsule Take 20 mg by mouth daily.   Yes [provider]  sertraline (ZOLOFT) 50 MG tablet Take 150 mg by mouth daily.   Yes [provider]    Anti-infectives (From admission, onward)   Start     Dose/Rate Route Frequency Ordered Stop   08/29/18 2000  piperacillin-tazobactam (ZOSYN) IVPB 3.375 g     3.375 g 12.5 mL/hr over 240 Minutes Intravenous Every 8 hours 08/29/18 1521     08/29/18 2000   vancomycin (VANCOCIN) 1,250 mg in sodium chloride 0.9 % 250 mL IVPB     1,250 mg 166.7 mL/hr over 90 Minutes Intravenous Every 18 hours 08/29/18 1521     08/29/18 1500  vancomycin (VANCOCIN) 50 mg/mL oral solution 125 mg  Status:  Discontinued     125 mg Oral Every 6 hours 08/29/18 1416 08/29/18 1513   08/29/18 1145  piperacillin-tazobactam (ZOSYN) IVPB 3.375 g     3.375 g 100 mL/hr over 30 Minutes Intravenous  Once 08/29/18 1132 08/29/18 1215   08/29/18 1145  vancomycin (VANCOCIN) IVPB 1000 mg/200 mL premix     1,000 mg 200 mL/hr over 60 Minutes Intravenous  Once 08/29/18 1132 08/29/18 1319      Scheduled Meds: . amLODipine  10 mg Oral Daily  . carvedilol  12.5 mg Oral BID  . enoxaparin (LOVENOX) injection  40 mg Subcutaneous Q24H  . glipiZIDE  5 mg Oral BID AC  . losartan  100 mg Oral Daily   And  . hydrochlorothiazide  25 mg Oral Daily  . [START ON 08/30/2018] Influenza vac split quadrivalent PF  0.5 mL Intramuscular Tomorrow-1000  . insulin aspart  0-5 Units Subcutaneous QHS  . insulin aspart  0-9 Units Subcutaneous TID WC  . pantoprazole  40 mg Oral Daily  . [START ON 08/30/2018] pneumococcal 23 valent vaccine  0.5 mL Intramuscular Tomorrow-1000  . sertraline  150 mg Oral Daily   Continuous Infusions: . piperacillin-tazobactam (ZOSYN)  IV    . vancomycin     PRN Meds:.acetaminophen **OR** acetaminophen, ondansetron **OR** ondansetron (ZOFRAN) IV  No Known Allergies  Physical Exam  Vitals  Blood pressure 118/72, pulse 73, temperature 97.8 F (36.6 C), temperature source Oral, resp. rate 20, height 5\' 5"  (1.651 m), weight 76.2 kg, SpO2 100 %.  Lower Extremity exam:  Vascular: Difficult to palpate pulses bilaterally.  Patient does have decent hair growth on the lower legs and on his feet and toes.  Color to his feet and toes is fairly good.  Capillary refill times approximately 3 seconds to his right great toe.  Dermatological: Third toe right foot has necrotic changes  there is necrosis of the skin to the distal portion of the toe it is somewhat mushy at the tissue underneath that the necrotic tissue consistent with some deeper necrosis.  Neurological: Diabetic neuropathy  Ortho: Patient has thin foot cavovarus with some contracture of his toes this is likely what caused pressure the tip of the digit.  X-rays reviewed show no evidence of osteomyelitis to the distal phalanx but  there likely is and will be exposure of the bone once this necrotic tissue was removed.  Data Review  CBC Recent Labs  Lab 08/29/18 1112  WBC 8.4  HGB 11.9*  HCT 33.5*  PLT 257  MCV 82.3  MCH 29.1  MCHC 35.4  RDW 13.9  LYMPHSABS 1.5  MONOABS 0.9  EOSABS 0.1  BASOSABS 0.0   ------------------------------------------------------------------------------------------------------------------  Chemistries  Recent Labs  Lab 08/29/18 1112  NA 140  K 4.0  CL 102  CO2 30  GLUCOSE 92  BUN 33*  CREATININE 1.43*  CALCIUM 9.0   -----------------------------------------------------------------------------------US Venous Img Lower Unilateral Right  Result Date: 08/29/2018 CLINICAL DATA:  57 year old male with right toe wound for the past 2 weeks. Evaluate for underlying DVT. EXAM: RIGHT LOWER EXTREMITY VENOUS DOPPLER ULTRASOUND TECHNIQUE: Gray-scale sonography with graded compression, as well as color Doppler and duplex ultrasound were performed to evaluate the lower extremity deep venous systems from the level of the common femoral vein and including the common femoral, femoral, profunda femoral, popliteal and calf veins including the posterior tibial, peroneal and gastrocnemius veins when visible. The superficial great saphenous vein was also interrogated. Spectral Doppler was utilized to evaluate flow at rest and with distal augmentation maneuvers in the common femoral, femoral and popliteal veins. COMPARISON:  None. FINDINGS: Contralateral Common Femoral Vein: Respiratory  phasicity is normal and symmetric with the symptomatic side. No evidence of thrombus. Normal compressibility. Common Femoral Vein: No evidence of thrombus. Normal compressibility, respiratory phasicity and response to augmentation. Saphenofemoral Junction: No evidence of thrombus. Normal compressibility and flow on color Doppler imaging. Profunda Femoral Vein: No evidence of thrombus. Normal compressibility and flow on color Doppler imaging. Femoral Vein: No evidence of thrombus. Normal compressibility, respiratory phasicity and response to augmentation. Popliteal Vein: No evidence of thrombus. Normal compressibility, respiratory phasicity and response to augmentation. Calf Veins: No evidence of thrombus. Normal compressibility and flow on color Doppler imaging. Superficial Great Saphenous Vein: No evidence of thrombus. Normal compressibility. Venous Reflux:  None. Other Findings:  None. IMPRESSION: No evidence of deep venous thrombosis. Electronically Signed   By: Malachy Moan M.D.   On: 08/29/2018 11:13   Dg Toe 3rd Right  Result Date: 08/29/2018 CLINICAL DATA:  Injured third toe of the right foot. EXAM: RIGHT THIRD TOE COMPARISON:  None. FINDINGS: There is no evidence of fracture or dislocation. There is no evidence of arthropathy or other focal bone abnormality. There is a soft tissue wound of the third digit. There is no periosteal reaction or bone destruction. IMPRESSION: Soft tissue wound of the third digit. No radiographic evidence of osteomyelitis of the third toe. Electronically Signed   By: Elige Ko   On: 08/29/2018 11:00    Assessment & Plan: Patient obviously has necrotic tissue and some distal gangrenous changes to the third toe of the right foot.  I think with removal of that dead tissue the distal phalanx will be exposed.  Even though there is no indication that there is osteomyelitis at this point I think his best chance to get his to heal appropriately will be removal of the distal  phalanx and removal of necrotic tissue with a primary closure.  I do not think you will have enough soft tissue to promote healing once this necrotic tissue was removed to the distal portion of the digit.  He states he had angioplasty done in [place] within the last few months and he states his foot is feeling a lot better.  Color is  good hair growth is good.  Also difficult to palpate but I think he is stable with his circulation at this point.  I think the necessity to go and remove that the necrotic tissue and exposed bone warrants progressing with surgery.  I will try to set this up for Thursday.  Active Problems:   Necrotic toes [facility](HCC)   Family Communication: Plan discussed with patient   Recardo EvangelistMatthew Uriah Philipson M.D on 08/29/2018 at 6:40 PM  Thank you for the consult, we will follow the patient with you in the Hospital.

## 2018-08-29 NOTE — ED Triage Notes (Signed)
Pt reports injured the 3rd toe on his right foot. Pt reports has been taking antibiotics for 2 weeks and had his toenail removed but the infection is still there.

## 2018-08-29 NOTE — Progress Notes (Signed)
Chaplain responded to and OR for an AD. Chaplain took a Spanish cope of the AD to the Pt. The Pt speaks English fluently by read Spanish better. Sheriff was at the bedside. Pt is handcuffed and foot cuffed. Chaplain explain complexities of finding witnesses. Chaplain had [facility] and found we had to offer the same services to all Pt. The challenges of finding witnesses was discussed. When telling the Pt about the complexities of finding a witness (their name being on the document) the Pt said he understands. When mentioning the witnesses  The sheriff  York SpanielSaid "that won't work". Chaplain left the document with the Pt for his review.    08/29/18 1500  Clinical Encounter Type  Visited With Patient  Visit Type Follow-up  Referral From Nurse  Spiritual Encounters  Spiritual Needs Brochure

## 2018-08-30 DIAGNOSIS — I10 Essential (primary) hypertension: Secondary | ICD-10-CM

## 2018-08-30 DIAGNOSIS — I2510 Atherosclerotic heart disease of native coronary artery without angina pectoris: Secondary | ICD-10-CM

## 2018-08-30 DIAGNOSIS — E119 Type 2 diabetes mellitus without complications: Secondary | ICD-10-CM

## 2018-08-30 DIAGNOSIS — I70262 Atherosclerosis of native arteries of extremities with gangrene, left leg: Secondary | ICD-10-CM

## 2018-08-30 LAB — CBC
HCT: 33.6 % — ABNORMAL LOW (ref 40.0–52.0)
Hemoglobin: 11.6 g/dL — ABNORMAL LOW (ref 13.0–18.0)
MCH: 28.6 pg (ref 26.0–34.0)
MCHC: 34.5 g/dL (ref 32.0–36.0)
MCV: 83 fL (ref 80.0–100.0)
Platelets: 253 10*3/uL (ref 150–440)
RBC: 4.05 MIL/uL — AB (ref 4.40–5.90)
RDW: 14 % (ref 11.5–14.5)
WBC: 7.5 10*3/uL (ref 3.8–10.6)

## 2018-08-30 LAB — BASIC METABOLIC PANEL
ANION GAP: 6 (ref 5–15)
BUN: 29 mg/dL — ABNORMAL HIGH (ref 6–20)
CALCIUM: 8.7 mg/dL — AB (ref 8.9–10.3)
CO2: 31 mmol/L (ref 22–32)
Chloride: 102 mmol/L (ref 98–111)
Creatinine, Ser: 1.23 mg/dL (ref 0.61–1.24)
GLUCOSE: 164 mg/dL — AB (ref 70–99)
Potassium: 3.8 mmol/L (ref 3.5–5.1)
SODIUM: 139 mmol/L (ref 135–145)

## 2018-08-30 LAB — GLUCOSE, CAPILLARY
GLUCOSE-CAPILLARY: 142 mg/dL — AB (ref 70–99)
GLUCOSE-CAPILLARY: 171 mg/dL — AB (ref 70–99)
GLUCOSE-CAPILLARY: 122 mg/dL — AB (ref 70–99)
Glucose-Capillary: 145 mg/dL — ABNORMAL HIGH (ref 70–99)

## 2018-08-30 LAB — HEMOGLOBIN A1C
Hgb A1c MFr Bld: 7.8 % — ABNORMAL HIGH (ref 4.8–5.6)
Mean Plasma Glucose: 177 mg/dL

## 2018-08-30 MED ORDER — ALPRAZOLAM 0.25 MG PO TABS
0.2500 mg | ORAL_TABLET | Freq: Once | ORAL | Status: AC
  Administered 2018-08-31: 0.25 mg via ORAL
  Filled 2018-08-30: qty 1

## 2018-08-30 MED ORDER — SERTRALINE HCL 50 MG PO TABS
50.0000 mg | ORAL_TABLET | Freq: Every day | ORAL | Status: DC
  Administered 2018-08-30 – 2018-09-01 (×3): 50 mg via ORAL
  Filled 2018-08-30 (×2): qty 1

## 2018-08-30 NOTE — Anesthesia Preprocedure Evaluation (Addendum)
Anesthesia Evaluation  Patient identified by MRN, date of birth, ID band Patient awake    Reviewed: Allergy & Precautions, H&P , NPO status , Patient's Chart, lab work & pertinent test results, reviewed documented beta blocker date and time   Airway Mallampati: III  TM Distance: >3 FB Neck ROM: full    Dental  (+) Dental Advidsory Given, Teeth Intact   Pulmonary neg shortness of breath, sleep apnea , neg COPD, neg recent URI, Current Smoker,           Cardiovascular Exercise Tolerance: Good hypertension, (-) angina+ CAD  (-) Past MI, (-) Cardiac Stents and (-) CABG (-) dysrhythmias (-) Valvular Problems/Murmurs     Neuro/Psych neg Seizures TIAnegative psych ROS   GI/Hepatic negative GI ROS, Neg liver ROS,   Endo/Other  diabetes  Renal/GU Renal disease (kidney stones)  negative genitourinary   Musculoskeletal   Abdominal   Peds  Hematology negative hematology ROS (+)   Anesthesia Other Findings Past Medical History: No date: Coronary artery disease No date: Diabetes mellitus without complication (HCC) No date: DVT (deep venous thrombosis) (HCC) No date: Hypertension No date: Renal calculi No date: TIA (transient ischemic attack)   Reproductive/Obstetrics negative OB ROS                            Anesthesia Physical Anesthesia Plan  ASA: III  Anesthesia Plan: General   Post-op Pain Management:    Induction:   PONV Risk Score and Plan: 1 and Ondansetron and Dexamethasone  Airway Management Planned: LMA  Additional Equipment:   Intra-op Plan:   Post-operative Plan: Extubation in OR  Informed Consent: I have reviewed the patients History and Physical, chart, labs and discussed the procedure including the risks, benefits and alternatives for the proposed anesthesia with the patient or authorized representative who has indicated his/her understanding and acceptance.   Dental  Advisory Given  Plan Discussed with: Anesthesiologist, CRNA and Surgeon  Anesthesia Plan Comments: (We would like medical clearance for this patient)       Anesthesia Quick Evaluation

## 2018-08-30 NOTE — Progress Notes (Signed)
Chaplain was referred by student nurse that Pt want a Quran. Chaplain delivered a Qu ran to Pt with sheriff at bedside.    08/30/18 1300  Clinical Encounter Type  Visited With Patient  Visit Type Follow-up  Referral From Nurse  Spiritual Encounters  Spiritual Needs Literature

## 2018-08-30 NOTE — Progress Notes (Signed)
[facility] - [place] [facility]   PATIENT NAME: Glen Peters    MR#:  [id number]  DATE OF BIRTH:  [birth year]  SUBJECTIVE:  [facility] he was noted to have necrotic right third toe. Denies any pain. police officer in the room  REVIEW OF SYSTEMS:   Review of Systems  Constitutional: Negative for chills, fever and weight loss.  HENT: Negative for ear discharge, ear pain and nosebleeds.   Eyes: Negative for blurred vision, pain and discharge.  Respiratory: Negative for sputum production, shortness of breath, wheezing and stridor.   Cardiovascular: Negative for chest pain, palpitations, orthopnea and PND.  Gastrointestinal: Negative for abdominal pain, diarrhea, nausea and vomiting.  Genitourinary: Negative for frequency and urgency.  Musculoskeletal: Negative for back pain and joint pain.  Neurological: Negative for sensory change, speech change, focal weakness and weakness.  Psychiatric/Behavioral: Negative for depression and hallucinations. The patient is not nervous/anxious.    Tolerating Diet:yes   DRUG ALLERGIES:  No Known Allergies  VITALS:  Blood pressure 136/75, pulse 72, temperature 97.7 F (36.5 C), temperature source Oral, resp. rate 16, height 5\' 5"  (1.651 m), weight 76.2 kg, SpO2 99 %.  PHYSICAL EXAMINATION:   Physical Exam  GENERAL:  57 y.o.-year-old patient lying in the bed with no acute distress.  EYES: Pupils equal, round, reactive to light and accommodation. No scleral icterus. Extraocular muscles intact.  HEENT: Head atraumatic, normocephalic. Oropharynx and nasopharynx clear.  NECK:  Supple, no jugular venous distention. No thyroid enlargement, no tenderness.  LUNGS: Normal breath sounds bilaterally, no wheezing, rales, rhonchi. No use of accessory muscles of respiration.  CARDIOVASCULAR: S1, S2 normal. No murmurs, rubs, or gallops.  ABDOMEN: Soft, nontender, nondistended. Bowel sounds present. No  organomegaly or mass.  EXTREMITIES: No cyanosis, clubbing or edema b/l.   Right 3rd toe necrosis + NEUROLOGIC: Cranial nerves II through XII are intact. No focal Motor or sensory deficits b/l.   PSYCHIATRIC:  patient is alert and oriented x 3.  SKIN: No obvious rash, lesion, or ulcer.   LABORATORY PANEL:  CBC Recent Labs  Lab 08/30/18 0550  WBC 7.5  HGB 11.6*  HCT 33.6*  PLT 253    Chemistries  Recent Labs  Lab 08/30/18 0550  NA 139  K 3.8  CL 102  CO2 31  GLUCOSE 164*  BUN 29*  CREATININE 1.23  CALCIUM 8.7*   Cardiac Enzymes No results for input(s): TROPONINI in the last 168 hours. RADIOLOGY:  US Venous Img Lower Unilateral Right  Result Date: 08/29/2018 CLINICAL DATA:  57 year old male with right toe wound for the past 2 weeks. Evaluate for underlying DVT. EXAM: RIGHT LOWER EXTREMITY VENOUS DOPPLER ULTRASOUND TECHNIQUE: Gray-scale sonography with graded compression, as well as color Doppler and duplex ultrasound were performed to evaluate the lower extremity deep venous systems from the level of the common femoral vein and including the common femoral, femoral, profunda femoral, popliteal and calf veins including the posterior tibial, peroneal and gastrocnemius veins when visible. The superficial great saphenous vein was also interrogated. Spectral Doppler was utilized to evaluate flow at rest and with distal augmentation maneuvers in the common femoral, femoral and popliteal veins. COMPARISON:  None. FINDINGS: Contralateral Common Femoral Vein: Respiratory phasicity is normal and symmetric with the symptomatic side. No evidence of thrombus. Normal compressibility. Common Femoral Vein: No evidence of thrombus. Normal compressibility, respiratory phasicity and response to augmentation. Saphenofemoral Junction: No evidence of thrombus. Normal compressibility and flow  on color Doppler imaging. Profunda Femoral Vein: No evidence of thrombus. Normal compressibility and flow on color  Doppler imaging. Femoral Vein: No evidence of thrombus. Normal compressibility, respiratory phasicity and response to augmentation. Popliteal Vein: No evidence of thrombus. Normal compressibility, respiratory phasicity and response to augmentation. Calf Veins: No evidence of thrombus. Normal compressibility and flow on color Doppler imaging. Superficial Great Saphenous Vein: No evidence of thrombus. Normal compressibility. Venous Reflux:  None. Other Findings:  None. IMPRESSION: No evidence of deep venous thrombosis. Electronically Signed   By: Malachy MoanHeath  McCullough M.D.   On: 08/29/2018 11:13   Dg Toe 3rd Right  Result Date: 08/29/2018 CLINICAL DATA:  Injured third toe of the right foot. EXAM: RIGHT THIRD TOE COMPARISON:  None. FINDINGS: There is no evidence of fracture or dislocation. There is no evidence of arthropathy or other focal bone abnormality. There is a soft tissue wound of the third digit. There is no periosteal reaction or bone destruction. IMPRESSION: Soft tissue wound of the third digit. No radiographic evidence of osteomyelitis of the third toe. Electronically Signed   By: Elige KoHetal  Nyasia Baxley   On: 08/29/2018 11:00   ASSESSMENT AND PLAN:  57 year old male with past medical history of coronary artery disease, previous TIA, diabetes, hypertension, history of severe obstructive sleep apnea status post hypoglossal nerve stimulator, who presents to the hospital due to a necrotic appearing right third toe with some foul-smelling drainage.  1.  Necrotic right third toe -- patient's x-ray shows no evidence of we will admit the patient to the hospital start him on broad-spectrum IV antibiotics vancomycin, Zosyn.  - We will get a podiatry consult, discussed the case with Dr. Orland Jarredroxler-- plans for amputation  2.  Diabetes type 2 with neuropathy- continue glipizide, will order some sliding scale insulin. -Check hemoglobin A 1C.  3.  Essential hypertension-continue carvedilol, Norvasc,  losartan/HCTZ.  4.  GERD-continue Protonix.  5.  Depression-continue Zoloft.  Case discussed with Care Management/Social Worker. Management plans discussed with the patient, family and they are in agreement.  CODE STATUS: full  DVT Prophylaxis: lovenox  TOTAL TIME TAKING CARE OF THIS PATIENT: *30* minutes.  >50% time spent on counselling and coordination of care  POSSIBLE D/C IN 2-3 DAYS, DEPENDING ON CLINICAL CONDITION.  Note: This dictation was prepared with Dragon dictation along with smaller phrase technology. Any transcriptional errors that result from this process are unintentional.  Enedina FinnerSona Dayven Linsley M.D on 08/30/2018 at 2:56 PM  Between 7am to 6pm - Pager - [phone]  After 6pm go to [email or web address] - Social research officer, governmentpassword [email or web address]  [facility]  Office  [phone]  CC: Primary care physician; Bakerolba, Reda Mohamed Hillis RangeHany Ahmed, MDPatient ID: Marjorie SmolderJose Peters, male   DOB: [birth year], 57 y.o.   MRN: [id number]

## 2018-08-30 NOTE — Plan of Care (Signed)
  Problem: Education: Goal: Knowledge of General Education information will improve Description Including pain rating scale, medication(s)/side effects and non-pharmacologic comfort measures Outcome: Progressing   

## 2018-08-30 NOTE — Progress Notes (Signed)
[facility]                                                      Patient Demographics  Markeis Allman, is a 57 y.o. male   MRN: [id number]   DOB - 08-07-1961  Admit Date - 08/29/2018    Outpatient Primary MD for the patient is [place], [place], MD  Consult requested in the Hospital by Enedina Finner, MD, On 08/30/2018    With History of -  Past Medical History:  Diagnosis Date  . Coronary artery disease   . Diabetes mellitus without complication (HCC)   . DVT (deep venous thrombosis) (HCC)   . Hypertension   . Renal calculi   . TIA (transient ischemic attack)       History reviewed. No pertinent surgical history.  in for   Chief Complaint  Patient presents with  . Toe Injury  . Toe Pain  . Wound Infection     HPI  Shabazz Mckey  is a 57 y.o. male, patient is a hospital with infected gangrenous third toe right foot.  States it was draining for a while is got a history of diabetes and peripheral arterial disease.  I examined him yesterday and it appeared fairly stable but gangrenous of the tip with likely exposed distal phalanx.  X-rays did not indicate osteomyelitis at this point.    Review of Systems he is alert well oriented and pleasant  In addition to the HPI above,  No Fever-chills, No Headache, No changes with Vision or hearing, No problems swallowing food or Liquids, No Chest pain, Cough or Shortness of Breath, No Abdominal pain, No Nausea or Vommitting, Bowel movements are regular, No Blood in stool or Urine, No dysuria, No new skin rashes or bruises, No new joints pains-aches,  No new weakness, tingling, numbness in any extremity, No recent weight gain or loss, No polyuria, polydypsia or polyphagia, No significant Mental Stressors.  A full 10 point Review of Systems was done, except as stated above, all other  Review of Systems were negative.   Social History Social History   Tobacco Use  . Smoking status: Current Every Day Smoker    Packs/day: 1.00    Years: 5.00    Pack years: 5.00    Types: Cigarettes  . Smokeless tobacco: Never Used  Substance Use Topics  . Alcohol use: Yes    Frequency: Never    Comment: Socially    Family History Family History  Problem Relation Age of Onset  . Diabetes Mother   . Hypertension Mother   . Diabetes Father   . Hypertension Father     Prior to Admission medications   Medication Sig Start Date End Date Taking? Authorizing Provider  acetaminophen (TYLENOL) 500 MG tablet Take 1,000 mg by mouth 2 (two) times daily. For 7 days 08/25/18 08/31/18 Yes [provider]  amLODipine (NORVASC) 10 MG tablet Take 10 mg by mouth daily.   Yes [provider]  carvedilol (COREG) 12.5 MG tablet Take 12.5 mg by mouth 2 (two) times daily.   Yes [provider]  ciprofloxacin (CIPRO) 500 MG tablet Take 500 mg by mouth 2 (two) times daily. For 20 days 08/13/18 09/01/18 Yes [provider]  clopidogrel (PLAVIX) 75 MG tablet Take 75 mg by mouth daily.  Yes [provider]  glipiZIDE (GLUCOTROL) 5 MG tablet Take 5 mg by mouth 2 (two) times daily.   Yes [provider]  losartan-hydrochlorothiazide (HYZAAR) 100-25 MG tablet Take 1 tablet by mouth daily.   Yes [provider]  omeprazole (PRILOSEC) 20 MG capsule Take 20 mg by mouth daily.   Yes [provider]  sertraline (ZOLOFT) 50 MG tablet Take 150 mg by mouth daily.   Yes [provider]    Anti-infectives (From admission, onward)   Start     Dose/Rate Route Frequency Ordered Stop   08/29/18 2000  piperacillin-tazobactam (ZOSYN) IVPB 3.375 g     3.375 g 12.5 mL/hr over 240 Minutes Intravenous Every 8 hours 08/29/18 1521     08/29/18 2000  vancomycin (VANCOCIN) 1,250 mg in sodium chloride 0.9 % 250 mL IVPB     1,250 mg 166.7 mL/hr over  90 Minutes Intravenous Every 18 hours 08/29/18 1521     08/29/18 1500  vancomycin (VANCOCIN) 50 mg/mL oral solution 125 mg  Status:  Discontinued     125 mg Oral Every 6 hours 08/29/18 1416 08/29/18 1513   08/29/18 1145  piperacillin-tazobactam (ZOSYN) IVPB 3.375 g     3.375 g 100 mL/hr over 30 Minutes Intravenous  Once 08/29/18 1132 08/29/18 1215   08/29/18 1145  vancomycin (VANCOCIN) IVPB 1000 mg/200 mL premix     1,000 mg 200 mL/hr over 60 Minutes Intravenous  Once 08/29/18 1132 08/29/18 1319      Scheduled Meds: . amLODipine  10 mg Oral Daily  . carvedilol  12.5 mg Oral BID  . enoxaparin (LOVENOX) injection  40 mg Subcutaneous Q24H  . glipiZIDE  5 mg Oral BID AC  . losartan  100 mg Oral Daily   And  . hydrochlorothiazide  25 mg Oral Daily  . insulin aspart  0-5 Units Subcutaneous QHS  . insulin aspart  0-9 Units Subcutaneous TID WC  . pantoprazole  40 mg Oral Daily  . sertraline  50 mg Oral Daily   Continuous Infusions: . piperacillin-tazobactam (ZOSYN)  IV 3.375 g (08/30/18 0622)  . vancomycin 1,250 mg (08/29/18 2057)   PRN Meds:.acetaminophen **OR** acetaminophen, ondansetron **OR** ondansetron (ZOFRAN) IV  No Known Allergies  Physical Exam  Vitals  Blood pressure 136/75, pulse 72, temperature 97.7 F (36.5 C), temperature source Oral, resp. rate 16, height 5\' 5"  (1.651 m), weight 76.2 kg, SpO2 99 %.  Lower Extremity exam: Bandages intact.  The wound yesterday showed necrosis of the tip of the toe and it appeared to be a very fluctuant type of damaged tissue underneath it which is likely going to leave the distal phalanx significantly exposed.  Data Review  CBC Recent Labs  Lab 08/29/18 1112 08/30/18 0550  WBC 8.4 7.5  HGB 11.9* 11.6*  HCT 33.5* 33.6*  PLT 257 253  MCV 82.3 83.0  MCH 29.1 28.6  MCHC 35.4 34.5  RDW 13.9 14.0  LYMPHSABS 1.5  --   MONOABS 0.9  --   EOSABS 0.1  --   BASOSABS 0.0  --     ------------------------------------------------------------------------------------------------------------------  Chemistries  Recent Labs  Lab 08/29/18 1112 08/30/18 0550  NA 140 139  K 4.0 3.8  CL 102 102  CO2 30 31  GLUCOSE 92 164*  BUN 33* 29*  CREATININE 1.43* 1.23  CALCIUM 9.0 8.7*   ------------------------------------------------------------------------------  Assessment & Plan: I explained the patient I felt like this needs to be addressed surgically.  I recommend debridement of  the infected tissue but also with the likely exposure of the bone would recommend removal of the distal phalanx.  This will result in loss of the nail complex was still even with a fairly functional toe.  He is in agreement with this.  He is understands the process and the consent was explained to him and he understands.  Hopefully will be able [facility] after surgery.  Is dependent upon the facility.  Active Problems:   Necrotic toes [facility](HCC)   Family Communication: Plan discussed with patient   Recardo EvangelistMatthew Aadin Gaut M.D on 08/30/2018 at 12:43 PM  Thank you for the consult, we will follow the patient with you in the Hospital.

## 2018-08-30 NOTE — Consult Note (Signed)
[facility] Vascular Consult Note  MRN : [id number]  Glen SmolderJose Peters is a 57 y.o. (29-Aug-1961) male who presents with chief complaint of  Chief Complaint  Patient presents with  . Toe Injury  . Toe Pain  . Wound Infection  .  History of Present Illness: I am asked to see the patient by Dr. Orland Jarredroxler and Dr. Cherlynn KaiserSainani to evaluate his perfusion in regards to gangrene of the right third toe.  There was some sort of injury and he has been dealing with this now for a couple of months.  It has worsened over the past week or 2.  He underwent revascularization of that leg with some what sounds like a percutaneous intervention several months ago in [place].  Since then, his leg and foot have generally felt better.  No left leg symptoms.  No fevers or chills.  Current Facility-Administered Medications  Medication Dose Route Frequency Provider Last Rate Last Dose  . acetaminophen (TYLENOL) tablet 650 mg  650 mg Oral Q6H PRN [place] SirenSainani, Vivek J, MD   650 mg at 08/30/18 1203   Or  . acetaminophen (TYLENOL) suppository 650 mg  650 mg Rectal Q6H PRN [place] SirenSainani, Vivek J, MD      . amLODipine (NORVASC) tablet 10 mg  10 mg Oral Daily [place] SirenSainani, Vivek J, MD   10 mg at 08/30/18 0929  . carvedilol (COREG) tablet 12.5 mg  12.5 mg Oral BID [place] SirenSainani, Vivek J, MD   12.5 mg at 08/30/18 0928  . enoxaparin (LOVENOX) injection 40 mg  40 mg Subcutaneous Q24H [place] SirenSainani, Vivek J, MD   40 mg at 08/29/18 2051  . glipiZIDE (GLUCOTROL) tablet 5 mg  5 mg Oral BID AC [place] SirenSainani, Vivek J, MD   5 mg at 08/30/18 0837  . losartan (COZAAR) tablet 100 mg  100 mg Oral Daily [place] SirenSainani, Vivek J, MD   100 mg at 08/30/18 [id number]   And  . hydrochlorothiazide (HYDRODIURIL) tablet 25 mg  25 mg Oral Daily [place] SirenSainani, Vivek J, MD   25 mg at 08/30/18 0929  . insulin aspart (novoLOG) injection 0-5 Units  0-5 Units Subcutaneous QHS Sainani, Vivek J, MD      . insulin aspart (novoLOG) injection 0-9 Units  0-9 Units Subcutaneous TID WC Houston SirenSainani, Vivek  J, MD   2 Units at 08/30/18 1158  . ondansetron (ZOFRAN) tablet 4 mg  4 mg Oral Q6H PRN [place] SirenSainani, Vivek J, MD       Or  . ondansetron (ZOFRAN) injection 4 mg  4 mg Intravenous Q6H PRN [place] SirenSainani, Vivek J, MD      . pantoprazole (PROTONIX) EC tablet 40 mg  40 mg Oral Daily [place] SirenSainani, Vivek J, MD   40 mg at 08/30/18 0930  . piperacillin-tazobactam (ZOSYN) IVPB 3.375 g  3.375 g Intravenous Q8H Marty HeckWang, Hannah L, RPH 12.5 mL/hr at 08/30/18 0622 3.375 g at 08/30/18 0622  . sertraline (ZOLOFT) tablet 50 mg  50 mg Oral Daily Enedina FinnerPatel, Sona, MD   50 mg at 08/30/18 0944  . vancomycin (VANCOCIN) 1,250 mg in sodium chloride 0.9 % 250 mL IVPB  1,250 mg Intravenous Q18H Marty HeckWang, Hannah L, RPH 166.7 mL/hr at 08/29/18 2057 1,250 mg at 08/29/18 2057    Past Medical History:  Diagnosis Date  . Coronary artery disease   . Diabetes mellitus without complication (HCC)   . DVT (deep venous thrombosis) (HCC)   . Hypertension   . Renal calculi   . TIA (transient ischemic attack)  History reviewed. No pertinent surgical history.  Social History Social History   Tobacco Use  . Smoking status: Current Every Day Smoker    Packs/day: 1.00    Years: 5.00    Pack years: 5.00    Types: Cigarettes  . Smokeless tobacco: Never Used  Substance Use Topics  . Alcohol use: Yes    Frequency: Never    Comment: Socially  . Drug use: Not Currently    Family History Family History  Problem Relation Age of Onset  . Diabetes Mother   . Hypertension Mother   . Diabetes Father   . Hypertension Father   No bleeding or clotting disorders  No Known Allergies   REVIEW OF SYSTEMS (Negative unless checked)  Constitutional: [] Weight loss  [] Fever  [] Chills Cardiac: [] Chest pain   [] Chest pressure   [] Palpitations   [] Shortness of breath when laying flat   [] Shortness of breath at rest   [] Shortness of breath with exertion. Vascular:  [] Pain in legs with walking   [] Pain in legs at rest   [] Pain in legs when laying flat    [] Claudication   [x] Pain in feet when walking  [x] Pain in feet at rest  [] Pain in feet when laying flat   [x] History of DVT   [] Phlebitis   [] Swelling in legs   [] Varicose veins   [x] Non-healing ulcers Pulmonary:   [] Uses home oxygen   [] Productive cough   [] Hemoptysis   [] Wheeze  [] COPD   [] Asthma Neurologic:  [] Dizziness  [] Blackouts   [] Seizures   [] History of stroke   [x] History of TIA  [] Aphasia   [] Temporary blindness   [] Dysphagia   [] Weakness or numbness in arms   [] Weakness or numbness in legs Musculoskeletal:  [] Arthritis   [] Joint swelling   [] Joint pain   [] Low back pain Hematologic:  [] Easy bruising  [] Easy bleeding   [] Hypercoagulable state   [] Anemic  [] Hepatitis Gastrointestinal:  [] Blood in stool   [] Vomiting blood  [] Gastroesophageal reflux/heartburn   [] Difficulty swallowing. Genitourinary:  [] Chronic kidney disease   [] Difficult urination  [] Frequent urination  [] Burning with urination   [] Blood in urine Skin:  [] Rashes   [x] Ulcers   [x] Wounds Psychological:  [] History of anxiety   []  History of major depression.  Physical Examination  Vitals:   08/29/18 1701 08/29/18 2352 08/30/18 0730 08/30/18 0908  BP: 118/72 (!) 143/74 (!) 149/83 136/75  Pulse: 73 70 71 72  Resp: 20 17 16    Temp: 97.8 F (36.6 C) 97.9 F (36.6 C) 97.7 F (36.5 C)   TempSrc: Oral Oral Oral   SpO2: 100% 98% 99%   Weight:      Height:       Body mass index is 27.96 kg/m. Gen:  WD/WN, NAD Head: [place]/AT, No temporalis wasting.  Ear/Nose/Throat: Hearing grossly intact, nares w/o erythema or drainage, oropharynx w/o Erythema/Exudate Eyes: Sclera non-icteric, conjunctiva clear Neck: Trachea midline.  No JVD.  Pulmonary:  Good air movement, respirations not labored, equal bilaterally.  Cardiac: RRR, normal S1, S2. Vascular:  Vessel Right Left  Radial Palpable Palpable                          PT  not palpable  1+ palpable  DP  1+ palpable  1+ palpable    Musculoskeletal: M/S 5/5  throughout.  Good temperature and color all the way down into the forefoot.  Normal hair growth to almost the ankle.  No deformity or atrophy. No  edema. Neurologic: Sensation grossly intact in extremities.  Symmetrical.  Speech is fluent. Motor exam as listed above. Psychiatric: Judgment intact, Mood & affect appropriate for pt's clinical situation. Dermatologic: Right third toe is bandaged but is pale at the base.  Minimal surrounding erythema.      CBC Lab Results  Component Value Date   WBC 7.5 08/30/2018   HGB 11.6 (L) 08/30/2018   HCT 33.6 (L) 08/30/2018   MCV 83.0 08/30/2018   PLT 253 08/30/2018    BMET    Component Value Date/Time   NA 139 08/30/2018 0550   K 3.8 08/30/2018 0550   CL 102 08/30/2018 0550   CO2 31 08/30/2018 0550   GLUCOSE 164 (H) 08/30/2018 0550   BUN 29 (H) 08/30/2018 0550   CREATININE 1.23 08/30/2018 0550   CALCIUM 8.7 (L) 08/30/2018 0550   GFRNONAA >60 08/30/2018 0550   GFRAA >60 08/30/2018 0550   Estimated Creatinine Clearance: 63.9 mL/min (by C-G formula based on SCr of 1.23 mg/dL).  COAG No results found for: INR, PROTIME  Radiology US Venous Img Lower Unilateral Right  Result Date: 08/29/2018 CLINICAL DATA:  57 year old male with right toe wound for the past 2 weeks. Evaluate for underlying DVT. EXAM: RIGHT LOWER EXTREMITY VENOUS DOPPLER ULTRASOUND TECHNIQUE: Gray-scale sonography with graded compression, as well as color Doppler and duplex ultrasound were performed to evaluate the lower extremity deep venous systems from the level of the common femoral vein and including the common femoral, femoral, profunda femoral, popliteal and calf veins including the posterior tibial, peroneal and gastrocnemius veins when visible. The superficial great saphenous vein was also interrogated. Spectral Doppler was utilized to evaluate flow at rest and with distal augmentation maneuvers in the common femoral, femoral and popliteal veins. COMPARISON:  None.  FINDINGS: Contralateral Common Femoral Vein: Respiratory phasicity is normal and symmetric with the symptomatic side. No evidence of thrombus. Normal compressibility. Common Femoral Vein: No evidence of thrombus. Normal compressibility, respiratory phasicity and response to augmentation. Saphenofemoral Junction: No evidence of thrombus. Normal compressibility and flow on color Doppler imaging. Profunda Femoral Vein: No evidence of thrombus. Normal compressibility and flow on color Doppler imaging. Femoral Vein: No evidence of thrombus. Normal compressibility, respiratory phasicity and response to augmentation. Popliteal Vein: No evidence of thrombus. Normal compressibility, respiratory phasicity and response to augmentation. Calf Veins: No evidence of thrombus. Normal compressibility and flow on color Doppler imaging. Superficial Great Saphenous Vein: No evidence of thrombus. Normal compressibility. Venous Reflux:  None. Other Findings:  None. IMPRESSION: No evidence of deep venous thrombosis. Electronically Signed   By: Malachy Moan M.D.   On: 08/29/2018 11:13   Dg Toe 3rd Right  Result Date: 08/29/2018 CLINICAL DATA:  Injured third toe of the right foot. EXAM: RIGHT THIRD TOE COMPARISON:  None. FINDINGS: There is no evidence of fracture or dislocation. There is no evidence of arthropathy or other focal bone abnormality. There is a soft tissue wound of the third digit. There is no periosteal reaction or bone destruction. IMPRESSION: Soft tissue wound of the third digit. No radiographic evidence of osteomyelitis of the third toe. Electronically Signed   By: Elige Ko   On: 08/29/2018 11:00      Assessment/Plan 1. PAD with gangrene right foot.  Podiatry has seen patient and recommended debridement/amputation of right third toe dead tissue.  The patient underwent revascularization few months ago which sounds like it improved his foot and the rest of the foot is warm and stable.  I  would proceed with  a debridement/amputation and if the blood flow does not appear adequate at the time of surgery, I can plan an angiogram next week.  It would appear clinically that his perfusion is decent even though his pulses are not easy to feel. 2.  Diabetes.  Glucose control very important for wound healing, atherosclerotic changes, and infection issues. 3.  Coronary disease.  Nitrates as needed for chest pain.  Any intervention we will follow telemetry and oxygen saturations throughout the procedure 4.  Hypertension.  Stable on outpatient medications and blood pressure control important in reducing the progression of atherosclerotic disease. On appropriate oral medications.    Festus Barren, MD  08/30/2018 12:33 PM    This note was created with Dragon medical transcription system.  Any error is purely unintentional

## 2018-08-30 NOTE — Progress Notes (Signed)
Pt requesting to only take 50mg  of Zoloft instead of the 150mg . MD Allena KatzPatel paged and says this is ok. Modified order to 50mg  Zoloft daily.

## 2018-08-31 ENCOUNTER — Inpatient Hospital Stay: Payer: Medicare Other | Admitting: Anesthesiology

## 2018-08-31 ENCOUNTER — Encounter: Payer: Self-pay | Admitting: *Deleted

## 2018-08-31 ENCOUNTER — Encounter: Admission: EM | Payer: Self-pay | Attending: Internal Medicine

## 2018-08-31 HISTORY — PX: AMPUTATION TOE: SHX6595

## 2018-08-31 LAB — GLUCOSE, CAPILLARY
GLUCOSE-CAPILLARY: 79 mg/dL (ref 70–99)
GLUCOSE-CAPILLARY: 98 mg/dL (ref 70–99)
GLUCOSE-CAPILLARY: 195 mg/dL — AB (ref 70–99)
Glucose-Capillary: 137 mg/dL — ABNORMAL HIGH (ref 70–99)
Glucose-Capillary: 84 mg/dL (ref 70–99)

## 2018-08-31 LAB — HIV ANTIBODY (ROUTINE TESTING W REFLEX): HIV Screen 4th Generation wRfx: NONREACTIVE

## 2018-08-31 SURGERY — AMPUTATION, TOE
Anesthesia: General | Site: Foot | Laterality: Right

## 2018-08-31 MED ORDER — EPHEDRINE SULFATE 50 MG/ML IJ SOLN
INTRAMUSCULAR | Status: DC | PRN
  Administered 2018-08-31 (×2): 10 mg via INTRAVENOUS

## 2018-08-31 MED ORDER — LIDOCAINE HCL 1 % IJ SOLN
INTRAMUSCULAR | Status: DC | PRN
  Administered 2018-08-31: 2 mL

## 2018-08-31 MED ORDER — SODIUM CHLORIDE 0.9 % IV SOLN
INTRAVENOUS | Status: DC | PRN
  Administered 2018-08-31: 250 mL via INTRAVENOUS

## 2018-08-31 MED ORDER — LIDOCAINE HCL (PF) 2 % IJ SOLN
INTRAMUSCULAR | Status: AC
  Filled 2018-08-31: qty 10

## 2018-08-31 MED ORDER — FENTANYL CITRATE (PF) 100 MCG/2ML IJ SOLN
INTRAMUSCULAR | Status: AC
  Filled 2018-08-31: qty 2

## 2018-08-31 MED ORDER — PROMETHAZINE HCL 25 MG/ML IJ SOLN: 6.2500 mg | INTRAMUSCULAR | Status: DC | PRN

## 2018-08-31 MED ORDER — PROPOFOL 10 MG/ML IV BOLUS
INTRAVENOUS | Status: AC
  Filled 2018-08-31: qty 20

## 2018-08-31 MED ORDER — SODIUM CHLORIDE 0.9 % IV SOLN
INTRAVENOUS | Status: DC | PRN
  Administered 2018-08-31: 50 ug/min via INTRAVENOUS

## 2018-08-31 MED ORDER — FENTANYL CITRATE (PF) 100 MCG/2ML IJ SOLN
INTRAMUSCULAR | Status: DC | PRN
  Administered 2018-08-31: 50 ug via INTRAVENOUS

## 2018-08-31 MED ORDER — PIPERACILLIN-TAZOBACTAM 3.375 G IVPB
INTRAVENOUS | Status: AC
  Filled 2018-08-31: qty 50

## 2018-08-31 MED ORDER — BUPIVACAINE HCL 0.5 % IJ SOLN
INTRAMUSCULAR | Status: DC | PRN
  Administered 2018-08-31: 2 mL

## 2018-08-31 MED ORDER — PROPOFOL 10 MG/ML IV BOLUS
INTRAVENOUS | Status: DC | PRN
  Administered 2018-08-31: 160 mg via INTRAVENOUS

## 2018-08-31 MED ORDER — ONDANSETRON HCL 4 MG/2ML IJ SOLN
INTRAMUSCULAR | Status: DC | PRN
  Administered 2018-08-31: 4 mg via INTRAVENOUS

## 2018-08-31 MED ORDER — PHENYLEPHRINE HCL 10 MG/ML IJ SOLN
INTRAMUSCULAR | Status: DC | PRN
  Administered 2018-08-31 (×2): 100 ug via INTRAVENOUS

## 2018-08-31 MED ORDER — LIDOCAINE HCL (CARDIAC) PF 100 MG/5ML IV SOSY
PREFILLED_SYRINGE | INTRAVENOUS | Status: DC | PRN
  Administered 2018-08-31: 100 mg via INTRAVENOUS

## 2018-08-31 MED ORDER — FENTANYL CITRATE (PF) 100 MCG/2ML IJ SOLN: 25.0000 ug | INTRAMUSCULAR | Status: DC | PRN

## 2018-08-31 MED ORDER — BUPIVACAINE HCL (PF) 0.5 % IJ SOLN
INTRAMUSCULAR | Status: AC
  Filled 2018-08-31: qty 30

## 2018-08-31 MED ORDER — HYDROCODONE-ACETAMINOPHEN 5-325 MG PO TABS
1.0000 | ORAL_TABLET | ORAL | Status: DC | PRN
  Administered 2018-08-31 – 2018-09-01 (×4): 1 via ORAL
  Filled 2018-08-31 (×4): qty 1

## 2018-08-31 SURGICAL SUPPLY — 35 items
BANDAGE ELASTIC 4 LF NS (GAUZE/BANDAGES/DRESSINGS) ×3 IMPLANT
BLADE MED AGGRESSIVE (BLADE) ×3 IMPLANT
BLADE SURG 15 STRL LF DISP TIS (BLADE) ×4 IMPLANT
BLADE SURG 15 STRL SS (BLADE) ×8
BNDG CONFORM 3 STRL LF (GAUZE/BANDAGES/DRESSINGS) ×3 IMPLANT
BNDG ESMARK 4X12 TAN STRL LF (GAUZE/BANDAGES/DRESSINGS) ×3 IMPLANT
BNDG GAUZE 4.5X4.1 6PLY STRL (MISCELLANEOUS) ×3 IMPLANT
CANISTER SUCT 1200ML W/VALVE (MISCELLANEOUS) ×3 IMPLANT
CUFF TOURN 18 STER (MISCELLANEOUS) ×3 IMPLANT
CUFF TOURN DUAL PL 12 NO SLV (MISCELLANEOUS) ×3 IMPLANT
DRAPE FLUOR MINI C-ARM 54X84 (DRAPES) ×3 IMPLANT
DURAPREP 26ML APPLICATOR (WOUND CARE) ×3 IMPLANT
ELECT REM PT RETURN 9FT ADLT (ELECTROSURGICAL) ×3
ELECTRODE REM PT RTRN 9FT ADLT (ELECTROSURGICAL) ×1 IMPLANT
GAUZE PETRO XEROFOAM 1X8 (MISCELLANEOUS) ×3 IMPLANT
GAUZE SPONGE 4X4 12PLY STRL (GAUZE/BANDAGES/DRESSINGS) ×3 IMPLANT
GLOVE BIO SURGEON STRL SZ8 (GLOVE) ×3 IMPLANT
GLOVE INDICATOR 8.0 STRL GRN (GLOVE) ×3 IMPLANT
GOWN STRL REUS W/ TWL LRG LVL3 (GOWN DISPOSABLE) ×2 IMPLANT
GOWN STRL REUS W/TWL LRG LVL3 (GOWN DISPOSABLE) ×4
KIT TURNOVER KIT A (KITS) ×3 IMPLANT
LABEL OR SOLS (LABEL) ×3 IMPLANT
NDL SAFETY ECLIPSE 18X1.5 (NEEDLE) ×1 IMPLANT
NEEDLE HYPO 18GX1.5 SHARP (NEEDLE) ×2
NEEDLE HYPO 25X1 1.5 SAFETY (NEEDLE) ×9 IMPLANT
NS IRRIG 500ML POUR BTL (IV SOLUTION) ×3 IMPLANT
PACK EXTREMITY ARMC (MISCELLANEOUS) ×3 IMPLANT
PENCIL ELECTRO HAND CTR (MISCELLANEOUS) ×3 IMPLANT
RASP SM TEAR CROSS CUT (RASP) ×3 IMPLANT
STOCKINETTE STRL 6IN 960660 (GAUZE/BANDAGES/DRESSINGS) ×3 IMPLANT
SUT ETH BLK MONO 3 0 FS 1 12/B (SUTURE) ×3 IMPLANT
SUT ETHILON 5 0 PS 2 18 (SUTURE) ×3 IMPLANT
SUT VIC AB 4-0 FS2 27 (SUTURE) ×3 IMPLANT
SWAB CULTURE AMIES ANAERIB BLU (MISCELLANEOUS) ×3 IMPLANT
SYR 10ML LL (SYRINGE) ×3 IMPLANT

## 2018-08-31 NOTE — Transfer of Care (Signed)
Immediate Anesthesia Transfer of Care Note  Patient: Glen Peters  Procedure(s) Performed: AMPUTATION RIGHT 3RD RAY (Right Foot)  Patient Location: PACU  Anesthesia Type:General  Level of Consciousness: awake, alert  and oriented  Airway & Oxygen Therapy: Patient connected to face mask oxygen  Post-op Assessment: Post -op Vital signs reviewed and stable  Post vital signs: stable  Last Vitals:  Vitals Value Taken Time  BP 106/66 08/31/2018  3:26 PM  Temp 36.3 C 08/31/2018  3:26 PM  Pulse 62 08/31/2018  3:26 PM  Resp 7 08/31/2018  3:26 PM  SpO2 100 % 08/31/2018  3:26 PM    Last Pain:  Vitals:   08/31/18 1526  TempSrc: Temporal  PainSc:       Patients Stated Pain Goal: 1 (08/31/18 0016)  Complications: No apparent anesthesia complications

## 2018-08-31 NOTE — Progress Notes (Signed)
Notified Dr. Anne HahnWillis of patient's request for something for anxiety. MD to place orders

## 2018-08-31 NOTE — Anesthesia Post-op Follow-up Note (Signed)
Anesthesia QCDR form completed.        

## 2018-08-31 NOTE — Op Note (Signed)
Operative note   Surgeon: Dr. Recardo EvangelistMatthew Dio Giller, DPM.    Assistant: None    Preop diagnosis: Gangrene third toe distally right foot    Postop diagnosis: Same    Procedure:   1.  Amputation distal portion of left third toe including distal phalanx and part of middle phalanx           EBL: 5 cc    Anesthesia:general letter by the anesthesia team I delivered 3 cc of 0.5% Marcaine plain mixed with lidocaine half-and-half at the base of the third toe preoperatively    Hemostasis: Ankle tourniquet 220 mils marked pressure for 10 minutes    Specimen: Gangrenous distal tip of the third toe right foot.  Culture of distal phalanx third toe    Complications: None    Operative indications: Gangrene    Procedure:  Patient was brought into the OR and placed on the operating table in thesupine position. After anesthesia was obtained theright lower extremity was prepped and draped in usual sterile fashion.  Operative Report: This time to direct to the distal third toe.  Patient essentially had a Of gangrenous necrotic tissue at the tip of the toe this was removed utilizing rongeurs and a 15 scalpel blade.  The margin of necrotic tissue to healthy tissue was then established and a fishmouth type incision was made around this region carried down to bone.  The distal phalanx was then freed up along with the distal portion of the toe and removed.  This point I elected to resect the head of the proximal phalanx as well.  The distal phalanx was sent for culture.  There is an copiously irrigated and tourniquet was released and prior to complete vascular seen return to the region with just some capillary bleeding.  After copious irrigation the wound was then closed with 3-0 Vicryl simple interrupted sutures.  This time a sterile dressing was placed across the area consisting of Xeroform gauze 4 x 4's conformer and Kerlix.    Patient tolerated the procedure and anesthesia well.  Was transported from the OR to  the PACU with all vital signs stable and vascular status intact. To be discharged per routine protocol.  Will follow up in approximately 1 week in the outpatient clinic.

## 2018-08-31 NOTE — Anesthesia Procedure Notes (Signed)
Procedure Name: LMA Insertion Performed by: Karlissa Aron, CRNA Pre-anesthesia Checklist: Patient identified, Patient being monitored, Timeout performed, Emergency Drugs available and Suction available Patient Re-evaluated:Patient Re-evaluated prior to induction Oxygen Delivery Method: Circle system utilized Preoxygenation: Pre-oxygenation with 100% oxygen Induction Type: IV induction Ventilation: Mask ventilation without difficulty LMA: LMA inserted LMA Size: 4.5 Tube type: Oral Number of attempts: 1 Placement Confirmation: positive ETCO2 and breath sounds checked- equal and bilateral Tube secured with: Tape Dental Injury: Teeth and Oropharynx as per pre-operative assessment        

## 2018-08-31 NOTE — Progress Notes (Signed)
[facility] [place] [facility]   PATIENT NAME: Glen SmolderJose Tomczak    MR#:  [id number]  DATE OF BIRTH:  1961/09/27  SUBJECTIVE:  [facility] he was noted to have necrotic right third toe. Denies any pain. police officer in the room No cp or sob  REVIEW OF SYSTEMS:   Review of Systems  Constitutional: Negative for chills, fever and weight loss.  HENT: Negative for ear discharge, ear pain and nosebleeds.   Eyes: Negative for blurred vision, pain and discharge.  Respiratory: Negative for sputum production, shortness of breath, wheezing and stridor.   Cardiovascular: Negative for chest pain, palpitations, orthopnea and PND.  Gastrointestinal: Negative for abdominal pain, diarrhea, nausea and vomiting.  Genitourinary: Negative for frequency and urgency.  Musculoskeletal: Negative for back pain and joint pain.  Neurological: Negative for sensory change, speech change, focal weakness and weakness.  Psychiatric/Behavioral: Negative for depression and hallucinations. The patient is not nervous/anxious.    Tolerating Diet:yes   DRUG ALLERGIES:  No Known Allergies  VITALS:  Blood pressure 116/65, pulse 75, temperature 97.8 F (36.6 C), temperature source Oral, resp. rate 18, height 5\' 5"  (1.651 m), weight 76.2 kg, SpO2 98 %.  PHYSICAL EXAMINATION:   Physical Exam  GENERAL:  57 y.o.-year-old patient lying in the bed with no acute distress.  EYES: Pupils equal, round, reactive to light and accommodation. No scleral icterus. Extraocular muscles intact.  HEENT: Head atraumatic, normocephalic. Oropharynx and nasopharynx clear.  NECK:  Supple, no jugular venous distention. No thyroid enlargement, no tenderness.  LUNGS: Normal breath sounds bilaterally, no wheezing, rales, rhonchi. No use of accessory muscles of respiration.  CARDIOVASCULAR: S1, S2 normal. No murmurs, rubs, or gallops.  ABDOMEN: Soft, nontender, nondistended. Bowel  sounds present. No organomegaly or mass.  EXTREMITIES: No cyanosis, clubbing or edema b/l.   Right 3rd toe necrosis + NEUROLOGIC: Cranial nerves II through XII are intact. No focal Motor or sensory deficits b/l.   PSYCHIATRIC:  patient is alert and oriented x 3.  SKIN: No obvious rash, lesion, or ulcer.   LABORATORY PANEL:  CBC Recent Labs  Lab 08/30/18 0550  WBC 7.5  HGB 11.6*  HCT 33.6*  PLT 253    Chemistries  Recent Labs  Lab 08/30/18 0550  NA 139  K 3.8  CL 102  CO2 31  GLUCOSE 164*  BUN 29*  CREATININE 1.23  CALCIUM 8.7*   Cardiac Enzymes No results for input(s): TROPONINI in the last 168 hours. RADIOLOGY:  [place] Venous Img Lower Unilateral Right  Result Date: 08/29/2018 CLINICAL DATA:  57 year old male with right toe wound for the past 2 weeks. Evaluate for underlying DVT. EXAM: RIGHT LOWER EXTREMITY VENOUS DOPPLER ULTRASOUND TECHNIQUE: Gray-scale sonography with graded compression, as well as color Doppler and duplex ultrasound were performed to evaluate the lower extremity deep venous systems from the level of the common femoral vein and including the common femoral, femoral, profunda femoral, popliteal and calf veins including the posterior tibial, peroneal and gastrocnemius veins when visible. The superficial great saphenous vein was also interrogated. Spectral Doppler was utilized to evaluate flow at rest and with distal augmentation maneuvers in the common femoral, femoral and popliteal veins. COMPARISON:  None. FINDINGS: Contralateral Common Femoral Vein: Respiratory phasicity is normal and symmetric with the symptomatic side. No evidence of thrombus. Normal compressibility. Common Femoral Vein: No evidence of thrombus. Normal compressibility, respiratory phasicity and response to augmentation. Saphenofemoral Junction: No evidence of thrombus.  Normal compressibility and flow on color Doppler imaging. Profunda Femoral Vein: No evidence of thrombus. Normal compressibility  and flow on color Doppler imaging. Femoral Vein: No evidence of thrombus. Normal compressibility, respiratory phasicity and response to augmentation. Popliteal Vein: No evidence of thrombus. Normal compressibility, respiratory phasicity and response to augmentation. Calf Veins: No evidence of thrombus. Normal compressibility and flow on color Doppler imaging. Superficial Great Saphenous Vein: No evidence of thrombus. Normal compressibility. Venous Reflux:  None. Other Findings:  None. IMPRESSION: No evidence of deep venous thrombosis. Electronically Signed   By: Malachy Moan M.D.   On: 08/29/2018 11:13   Dg Toe 3rd Right  Result Date: 08/29/2018 CLINICAL DATA:  Injured third toe of the right foot. EXAM: RIGHT THIRD TOE COMPARISON:  None. FINDINGS: There is no evidence of fracture or dislocation. There is no evidence of arthropathy or other focal bone abnormality. There is a soft tissue wound of the third digit. There is no periosteal reaction or bone destruction. IMPRESSION: Soft tissue wound of the third digit. No radiographic evidence of osteomyelitis of the third toe. Electronically Signed   By: Elige Ko   On: 08/29/2018 11:00   ASSESSMENT AND PLAN:  57 year old male with past medical history of coronary artery disease, previous TIA, diabetes, hypertension, history of severe obstructive sleep apnea status post hypoglossal nerve stimulator, who presents to the hospital due to a necrotic appearing right third toe with some foul-smelling drainage.  1.  Necrotic right third toe -- patient's x-ray shows no evidence of we will admit the patient to the hospital start him on broad-spectrum IV antibiotics vancomycin, Zosyn.  - We will get a podiatry consult, discussed the case with Dr. Orland Jarred-- plans for amputation -pt has no cp or sob. He is stable/at baseline medicallyfor surgery  2.  Diabetes type 2 with neuropathy- continue glipizide -cont sliding scale insulin. -Check hemoglobin A  1C.  3.  Essential hypertension-continue carvedilol, Norvasc, losartan/HCTZ.  4.  GERD-continue Protonix.  5.  Depression-continue Zoloft.  Case discussed with Care Management/Social Worker. Management plans discussed with the patient, family and they are in agreement.  CODE STATUS: full  DVT Prophylaxis: lovenox  TOTAL TIME TAKING CARE OF THIS PATIENT: *30* minutes.  >50% time spent on counselling and coordination of care  POSSIBLE D/C IN 2-3 DAYS, DEPENDING ON CLINICAL CONDITION.  Note: This dictation was prepared with Dragon dictation along with smaller phrase technology. Any transcriptional errors that result from this process are unintentional.  Enedina Finner M.D on 08/31/2018 at 10:41 AM  Between 7am to 6pm - Pager - [phone]  After 6pm go to [email or web address] - Social research officer, government  [facility]  Office  [phone]  CC: Primary care physician; Hanalei, Reda Mohamed Hillis Range, MDPatient ID: Glen Peters, male   DOB: 04-17-1961, 57 y.o.   MRN: [id number]

## 2018-09-01 ENCOUNTER — Encounter: Payer: Self-pay | Admitting: Podiatry

## 2018-09-01 LAB — GLUCOSE, CAPILLARY
Glucose-Capillary: 163 mg/dL — ABNORMAL HIGH (ref 70–99)
Glucose-Capillary: 162 mg/dL — ABNORMAL HIGH (ref 70–99)
Glucose-Capillary: 141 mg/dL — ABNORMAL HIGH (ref 70–99)

## 2018-09-01 LAB — VANCOMYCIN, TROUGH: VANCOMYCIN TR: 18 ug/mL (ref 15–20)

## 2018-09-01 MED ORDER — AMOXICILLIN-POT CLAVULANATE 875-125 MG PO TABS: 1.0000 | ORAL_TABLET | Freq: Two times a day (BID) | ORAL | 0 refills | Status: AC

## 2018-09-01 MED ORDER — IBUPROFEN 400 MG PO TABS: 400.0000 mg | ORAL_TABLET | Freq: Three times a day (TID) | ORAL | 0 refills | Status: AC | PRN

## 2018-09-01 MED ORDER — IBUPROFEN 400 MG PO TABS: 800.0000 mg | ORAL_TABLET | Freq: Three times a day (TID) | ORAL | 0 refills | Status: DC | PRN

## 2018-09-01 MED ORDER — AMOXICILLIN-POT CLAVULANATE 875-125 MG PO TABS
1.0000 | ORAL_TABLET | Freq: Two times a day (BID) | ORAL | Status: DC
  Administered 2018-09-01: 1 via ORAL
  Filled 2018-09-01: qty 1

## 2018-09-01 NOTE — Discharge Summary (Signed)
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 Coronary artery disease   . Diabetes mellitus without complication (HCC)   . DVT (deep venous thrombosis) (HCC)   . Hypertension   . Renal calculi   . TIA (transient ischemic attack)     HOSPITAL COURSE:  57 year old male with past medical history of coronary artery disease, previous TIA, diabetes, hypertension, history of severe obstructive sleep apnea status post hypoglossal nerve stimulator, who presents to the hospital due to a necrotic appearing right third toe with some foul-smelling drainage.  1. Necrotic right third toe-- status post amputation -- patient's x-ray shows no evidence OM -received broad-spectrum IV antibiotics vancomycin, Zosyn.---Change to oral Augmentin -appreciate consult with Dr. Orland Jarred-- status post amputation-- recommends keep dressing on till seen by Dr. Orland Jarred next week in the clinic. Continue wearing postop shoe   2. Diabetes type 2 withneuropathy- continue glipizide -cont sliding scale insulin. -Check hemoglobin A 1C.-- 7.8  3. Essential hypertension-continue carvedilol, Norvasc, losartan/HCTZ.  4. GERD-continue Protonix.  5. Depression-continue Zoloft.  Overall stable. [facility] Discussed with the officer. RN aware. Discussed with Dr. Orland Jarred. CONSULTS OBTAINED:  Treatment Team:  Recardo Evangelist, DPM Stegmayer, Ranae Plumber, PA-C  DRUG  ALLERGIES:  No Known Allergies  DISCHARGE MEDICATIONS:   Allergies as of 09/01/2018   No Known Allergies     Medication List    STOP taking these medications   acetaminophen 500 MG tablet Commonly known as:  TYLENOL   ciprofloxacin 500 MG tablet Commonly known as:  CIPRO     TAKE these medications   amLODipine 10 MG tablet Commonly known as:  NORVASC Take 10 mg by mouth daily.   amoxicillin-clavulanate 875-125 MG tablet Commonly known as:  AUGMENTIN Take 1 tablet by mouth every 12 (twelve) hours.   carvedilol 12.5 MG tablet Commonly known as:  COREG Take 12.5 mg by mouth 2 (two) times daily.   clopidogrel 75 MG tablet Commonly known as:  PLAVIX Take 75 mg by mouth daily.   glipiZIDE 5 MG tablet Commonly known as:  GLUCOTROL Take 5 mg by mouth 2 (two) times daily.   ibuprofen 400 MG tablet Commonly known as:  ADVIL,MOTRIN Take 1 tablet (400 mg total) by mouth every 8 (eight) hours as needed for moderate pain.   losartan-hydrochlorothiazide 100-25 MG tablet Commonly known as:  HYZAAR Take 1 tablet by mouth daily.   omeprazole 20 MG capsule Commonly known as:  PRILOSEC Take 20 mg by mouth daily.   sertraline 50 MG tablet Commonly known as:  ZOLOFT Take 150 mg by mouth daily.       If you experience worsening of your admission symptoms, develop shortness of breath, life threatening emergency, suicidal or homicidal thoughts you must seek medical attention immediately by calling 911 or calling your MD immediately  if symptoms less severe.  You Must read complete instructions/literature along with all the possible adverse reactions/side effects for all the Medicines you take  and that have been prescribed to you. Take any new Medicines after you have completely understood and accept all the possible adverse reactions/side effects.   Please note  You were cared for by a hospitalist during your hospital stay. If you have any questions about your discharge  medications or the care you received while you were in the hospital after you are discharged, you can call the unit and asked to speak with the hospitalist on call if the hospitalist that took care of you is not available. Once you are discharged, your primary care physician will handle any further medical issues. Please note that NO REFILLS for any discharge medications will be authorized once you are discharged, as it is imperative that you return to your primary care physician (or establish a relationship with a primary care physician if you do not have one) for your aftercare needs so that they can reassess your need for medications and monitor your lab values. Today   SUBJECTIVE   Doing well  VITAL SIGNS:  Blood pressure 117/85, pulse 76, temperature 98 F (36.7 C), temperature source Oral, resp. rate 18, height 5\' 5"  (1.651 m), weight 76.2 kg, SpO2 96 %.  I/O:    Intake/Output Summary (Last 24 hours) at 09/01/2018 0927 Last data filed at 08/31/2018 1845 Gross per 24 hour  Intake 640 ml  Output -  Net 640 ml    PHYSICAL EXAMINATION:  GENERAL:  57 y.o.-year-old patient lying in the bed with no acute distress.  EYES: Pupils equal, round, reactive to light and accommodation. No scleral icterus. Extraocular muscles intact.  HEENT: Head atraumatic, normocephalic. Oropharynx and nasopharynx clear.  NECK:  Supple, no jugular venous distention. No thyroid enlargement, no tenderness.  LUNGS: Normal breath sounds bilaterally, no wheezing, rales,rhonchi or crepitation. No use of accessory muscles of respiration.  CARDIOVASCULAR: S1, S2 normal. No murmurs, rubs, or gallops.  ABDOMEN: Soft, non-tender, non-distended. Bowel sounds present. No organomegaly or mass.  EXTREMITIES: No pedal edema, cyanosis, or clubbing. Right foot post -op dressing + NEUROLOGIC: Cranial nerves II through XII are intact. Muscle strength 5/5 in all extremities. Sensation intact. Gait not checked.  PSYCHIATRIC: The  patient is alert and oriented x 3.  SKIN: No obvious rash, lesion, or ulcer.   DATA REVIEW:   CBC  Recent Labs  Lab 08/30/18 0550  WBC 7.5  HGB 11.6*  HCT 33.6*  PLT 253    Chemistries  Recent Labs  Lab 08/30/18 0550  NA 139  K 3.8  CL 102  CO2 31  GLUCOSE 164*  BUN 29*  CREATININE 1.23  CALCIUM 8.7*    Microbiology Results   Recent Results (from the past 240 hour(s))  MRSA PCR Screening     Status: None   Collection Time: 08/29/18  2:41 PM  Result Value Ref Range Status   MRSA by PCR NEGATIVE NEGATIVE Final    Comment:        The GeneXpert MRSA Assay (FDA approved for NASAL specimens only), is one component of a comprehensive MRSA colonization surveillance program. It is not intended to diagnose MRSA infection nor to guide or monitor treatment for MRSA infections. Performed at [facility], [address] [place], [place] [address]   Aerobic/Anaerobic Culture (surgical/deep wound)     Status: None (Preliminary result)   Collection Time: 08/31/18  3:04 PM  Result Value Ref Range Status   Specimen Description   Final    BONE Performed at [facility], [address] [place], [place] [address]    Special Requests   Final    NONE Performed at [facility], [address] [place], [place] [address]    Gram Stain   Final    FEW WBC PRESENT,BOTH PMN AND MONONUCLEAR FEW GRAM POSITIVE COCCI    Culture   Final    NO GROWTH < 12 HOURS Performed at [facility], [address] [place], [place] [address]    Report Status PENDING  Incomplete    RADIOLOGY:  No results found.   Management plans discussed with the patient, family and they are in agreement.  CODE STATUS:     Code Status Orders  (From admission, onward)         Start     Ordered   08/29/18 1308  Full code  Continuous     08/29/18 1307        Code Status History    This patient has a current code status but no historical code status.       TOTAL TIME TAKING CARE OF THIS PATIENT: *40* minutes.    Enedina FinnerSona Nnamdi Dacus M.D on 09/01/2018 at 9:27 AM  Between 7am to 6pm - Pager - [phone] After 6pm go to [email or web address] - Social research officer, governmentpassword [email or web address]  [facility]  Office  [phone]  CC: Primary care physician; [place], [place] Mohamed Hany Ahmed, MD

## 2018-09-01 NOTE — Discharge Instructions (Addendum)
Wear hard sole post op shoe when walking  Dressing to be changed on Saturday and Every other day until follow up with Podiatry next week.   Use gauze and kerlix for dressing change.

## 2018-09-01 NOTE — Anesthesia Postprocedure Evaluation (Signed)
Anesthesia Post Note  Patient: Glen Peters  Procedure(s) Performed: AMPUTATION RIGHT 3RD RAY (Right Foot)  Patient location during evaluation: PACU Anesthesia Type: General Level of consciousness: awake and alert and oriented Pain management: pain level controlled Vital Signs Assessment: post-procedure vital signs reviewed and stable Respiratory status: spontaneous breathing Cardiovascular status: blood pressure returned to baseline Anesthetic complications: no     Last Vitals:  Vitals:   08/31/18 1925 09/01/18 0034  BP: 127/72 140/78  Pulse: 69 72  Resp: 20 20  Temp: (!) 36.4 C 36.7 C  SpO2: 97% 100%    Last Pain:  Vitals:   09/01/18 0039  TempSrc:   PainSc: Asleep                 Rosanna Bickle

## 2018-09-01 NOTE — Progress Notes (Signed)
Pharmacy Antibiotic Note  Marjorie SmolderJose Pal is a 57 y.o. male admitted on 08/29/2018 with wound infection.  Pharmacy has been consulted for vancomycin and Zosyn dosing. Pt received Zosyn 3.375 g IV x1 and vancomycin 1g IV x1 in the ED.   Plan: Zosyn 3.375g IV q8h (4 hour infusion).   Vancomycin 1250 mg IV q18h with stacked dosing. Will need to follow renal function closely. Vancomycin trough before 4th dose of regimen.   Ke 0.046, half life 15 h, Vd 53.3 L  Height: 5\' 5"  (165.1 cm) Weight: 168 lb (76.2 kg) IBW/kg (Calculated) : 61.5  Temp (24hrs), Avg:97.7 F (36.5 C), Min:97.3 F (36.3 C), Max:98 F (36.7 C)  Recent Labs  Lab 08/29/18 1112 08/30/18 0550 09/01/18 0115  WBC 8.4 7.5  --   CREATININE 1.43* 1.23  --   VANCOTROUGH  --   --  18    Estimated Creatinine Clearance: 63.9 mL/min (by C-G formula based on SCr of 1.23 mg/dL).    No Known Allergies  Antimicrobials this admission: Vanc/Zosyn 9/17>>  Dose adjustments this admission: 9/20 0100 vanc level 18. Continue current regimen  Microbiology results:    Thank you for allowing pharmacy to be a part of this patient's care.  Emmersyn Kratzke S 09/01/2018 2:35 AM

## 2018-09-01 NOTE — Plan of Care (Signed)
  Problem: Education: Goal: Knowledge of General Education information will improve Description Including pain rating scale, medication(s)/side effects and non-pharmacologic comfort measures Outcome: Adequate for Discharge   Problem: Health Behavior/Discharge Planning: Goal: Ability to manage health-related needs will improve Outcome: Adequate for Discharge   Problem: Clinical Measurements: Goal: Ability to maintain clinical measurements within normal limits will improve Outcome: Adequate for Discharge Goal: Will remain free from infection Outcome: Adequate for Discharge Goal: Diagnostic test results will improve Outcome: Adequate for Discharge Goal: Respiratory complications will improve Outcome: Adequate for Discharge Goal: Cardiovascular complication will be avoided Outcome: Adequate for Discharge   Problem: Activity: Goal: Risk for activity intolerance will decrease Outcome: Adequate for Discharge   Problem: Nutrition: Goal: Adequate nutrition will be maintained Outcome: Adequate for Discharge   Problem: Coping: Goal: Level of anxiety will decrease Outcome: Adequate for Discharge   Problem: Elimination: Goal: Will not experience complications related to bowel motility Outcome: Adequate for Discharge Goal: Will not experience complications related to urinary retention Outcome: Adequate for Discharge   Problem: Pain Managment: Goal: General experience of comfort will improve Outcome: Adequate for Discharge   Problem: Safety: Goal: Ability to remain free from injury will improve Outcome: Adequate for Discharge   Problem: Skin Integrity: Goal: Risk for impaired skin integrity will decrease Outcome: Adequate for Discharge   Problem: Spiritual Needs Goal: Ability to function at adequate level Outcome: Adequate for Discharge   Problem: Education: Goal: Knowledge of the prescribed therapeutic regimen will improve Outcome: Adequate for Discharge Goal: Ability to  verbalize activity precautions or restrictions will improve Outcome: Adequate for Discharge Goal: Understanding of discharge needs will improve Outcome: Adequate for Discharge   Problem: Activity: Goal: Ability to perform//tolerate increased activity and mobilize with assistive devices will improve Outcome: Adequate for Discharge   Problem: Clinical Measurements: Goal: Postoperative complications will be avoided or minimized Outcome: Adequate for Discharge   Problem: Self-Care: Goal: Ability to meet self-care needs will improve Outcome: Adequate for Discharge   Problem: Self-Concept: Goal: Ability to maintain and perform role responsibilities to the fullest extent possible will improve Outcome: Adequate for Discharge   Problem: Pain Management: Goal: Pain level will decrease with appropriate interventions Outcome: Adequate for Discharge   Problem: Skin Integrity: Goal: Demonstration of wound healing without infection will improve Outcome: Adequate for Discharge

## 2018-09-02 IMAGING — DX DG CHEST 1V
1 series · 1 of 1 positions shown · non-contrast
Comparison: None.

CLINICAL DATA: Central chest pain since this afternoon.

EXAM:
CHEST  1 VIEW

[chest ap]
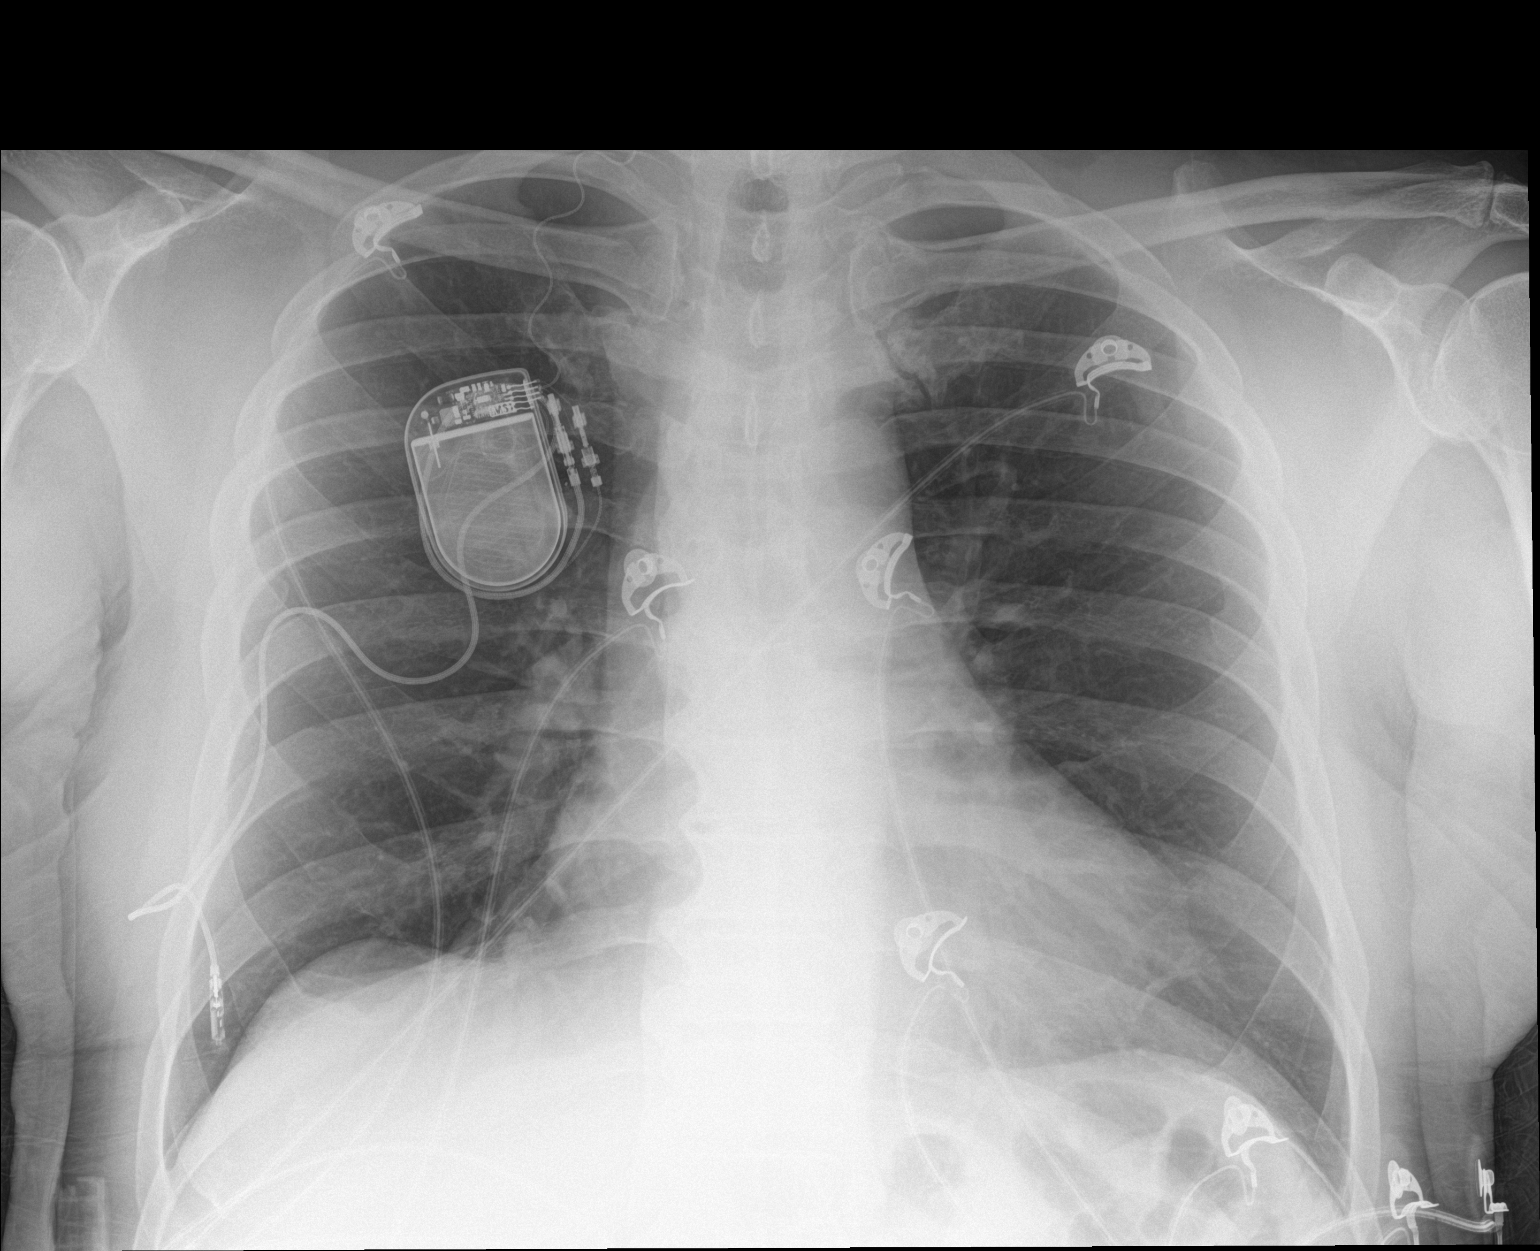

[1 of 1 positions shown; findings below may reference images not displayed]

FINDINGS: Heart and mediastinal contours are unremarkable. Generator pack
projects over the right upper lobe with leads projecting up the
right side of neck and incompletely included. Additional thicker
lead projects over the right costophrenic angle. No acute osseous
abnormality. Clear lungs.
IMPRESSION: No active disease.

## 2018-09-04 LAB — SURGICAL PATHOLOGY

## 2018-09-07 LAB — AEROBIC/ANAEROBIC CULTURE W GRAM STAIN (SURGICAL/DEEP WOUND)

## 2018-09-07 LAB — AEROBIC/ANAEROBIC CULTURE (SURGICAL/DEEP WOUND)

## 2018-10-11 IMAGING — US US EXTREM LOW VENOUS*R*
1 series · 13 of 24 positions shown · non-contrast
Comparison: None.

CLINICAL DATA: 57-year-old male with right toe wound for the past 2
weeks. Evaluate for underlying DVT.



[Series 1: us extrem low venous*right* · 13 of 34 slices shown]
[im 1/34]
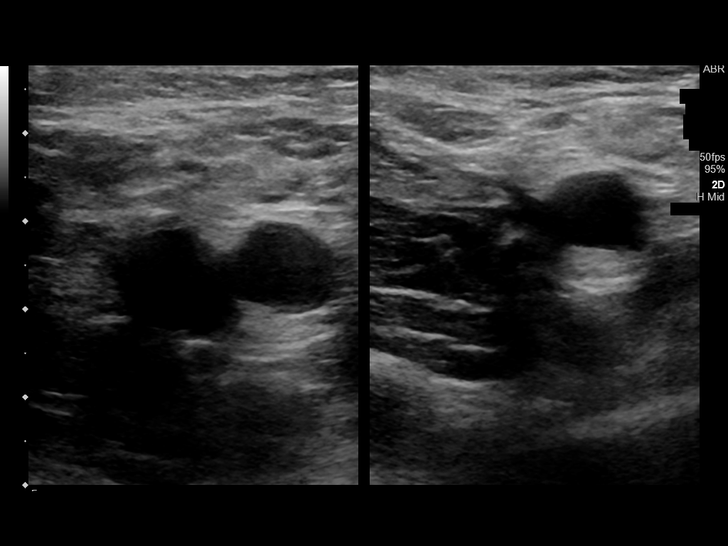
[im 3/34]
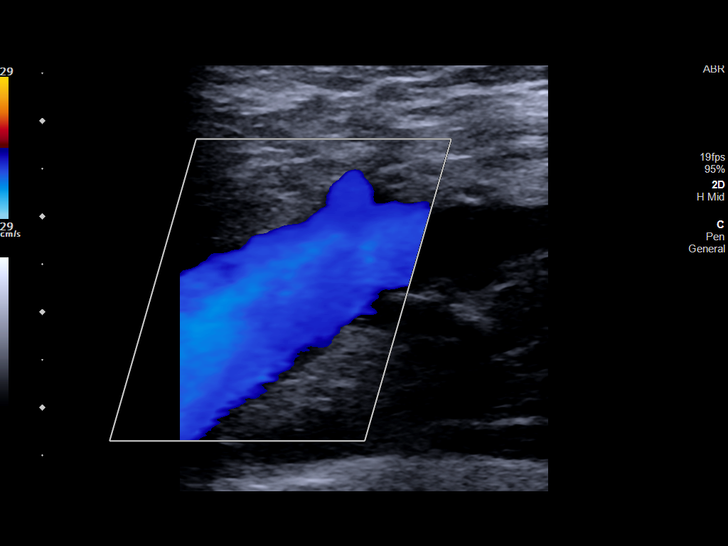
[im 6/34]
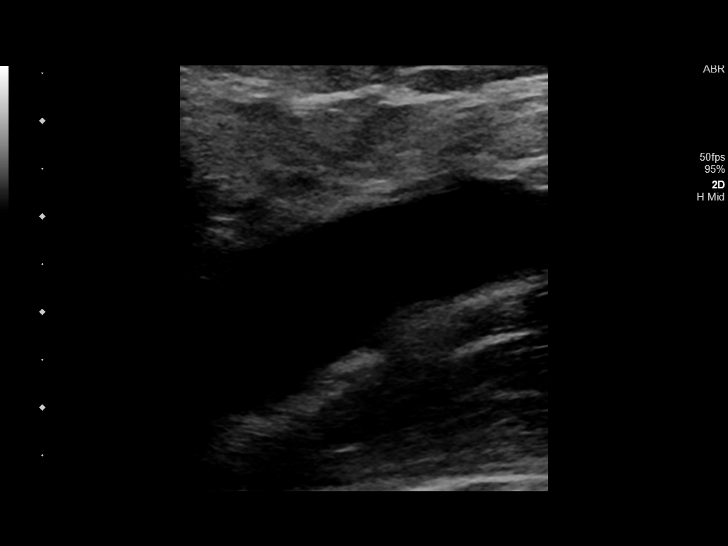
[im 9/34]
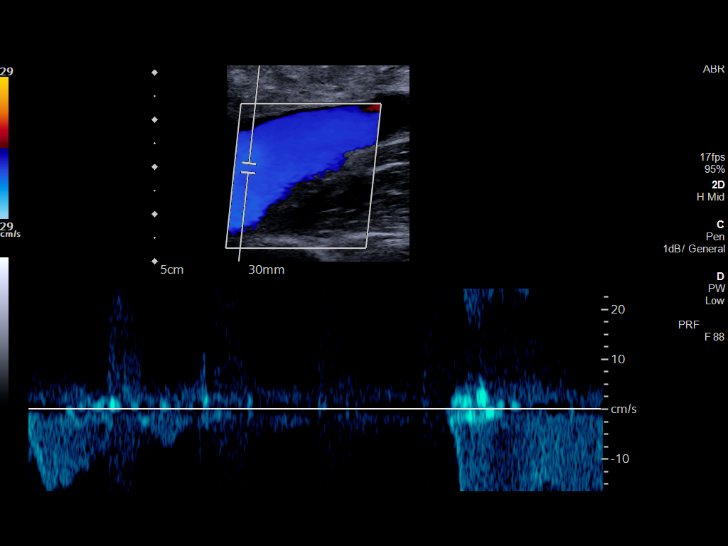
[im 12/34]
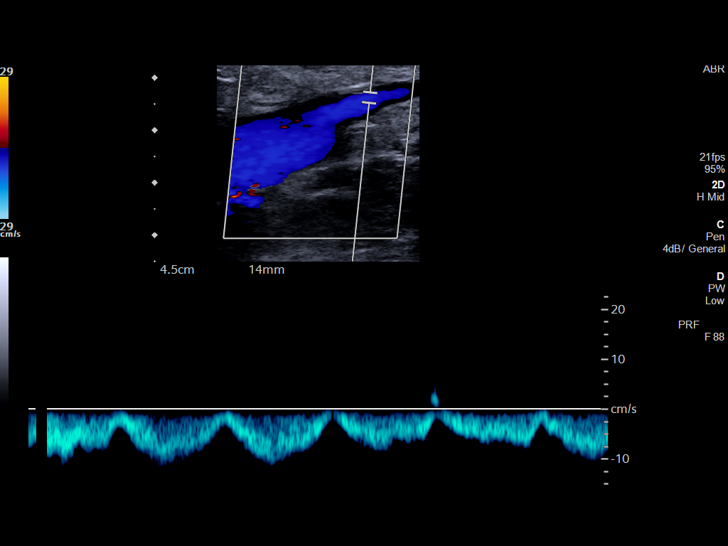
[im 15/34]
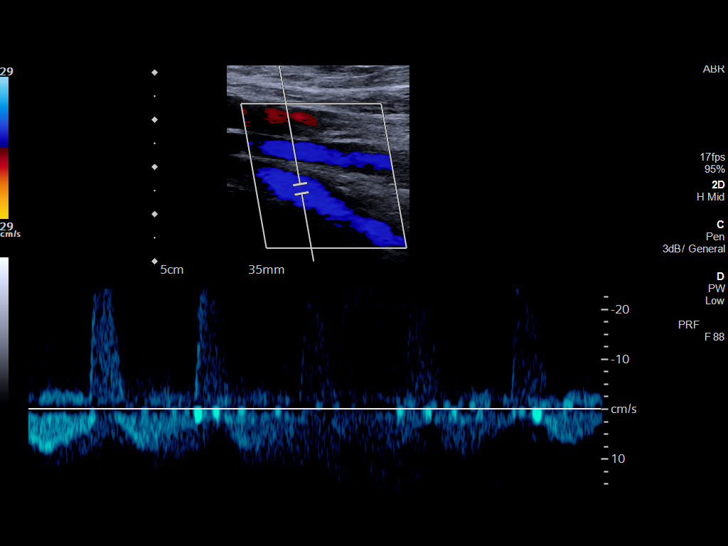
[im 18/34]
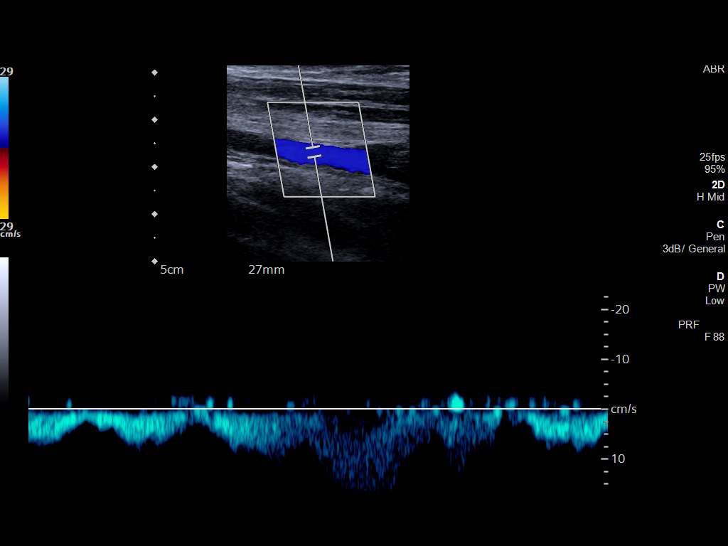
[im 19/34]
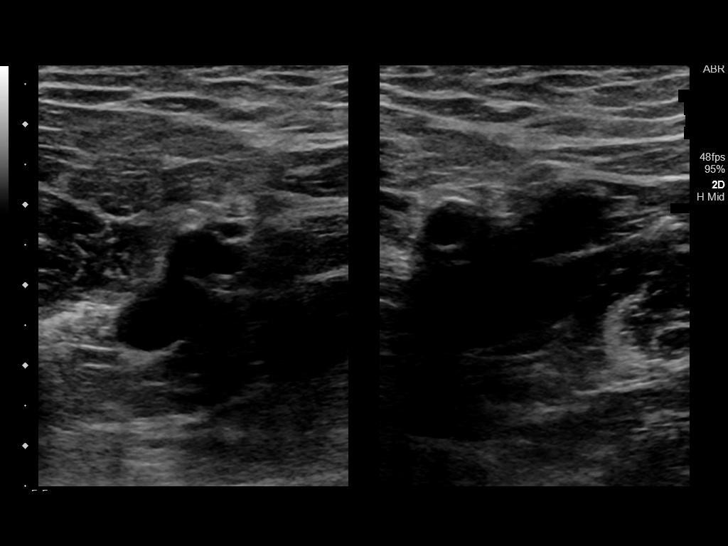
[im 22/34]
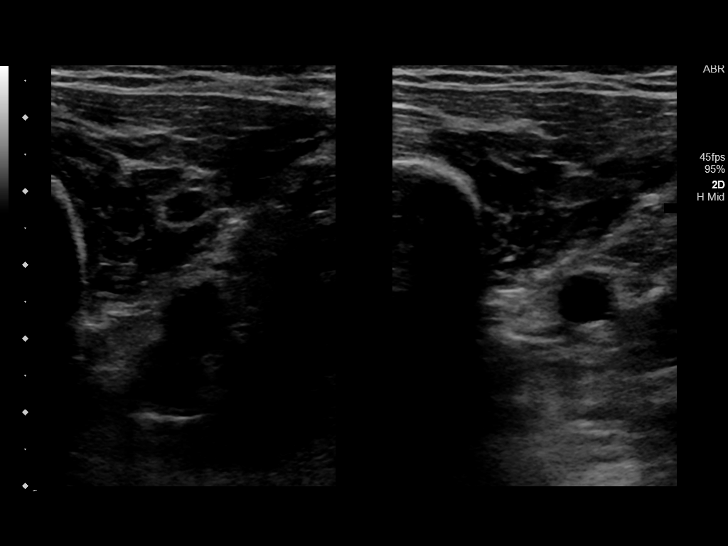
[im 25/34]
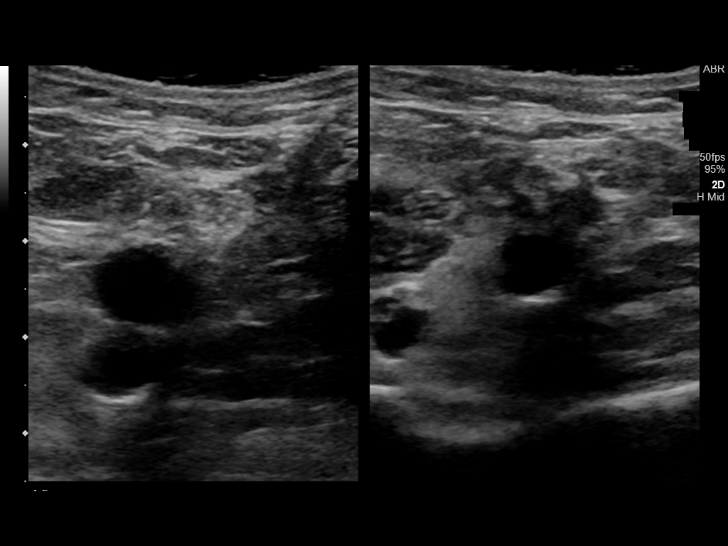
[im 28/34]
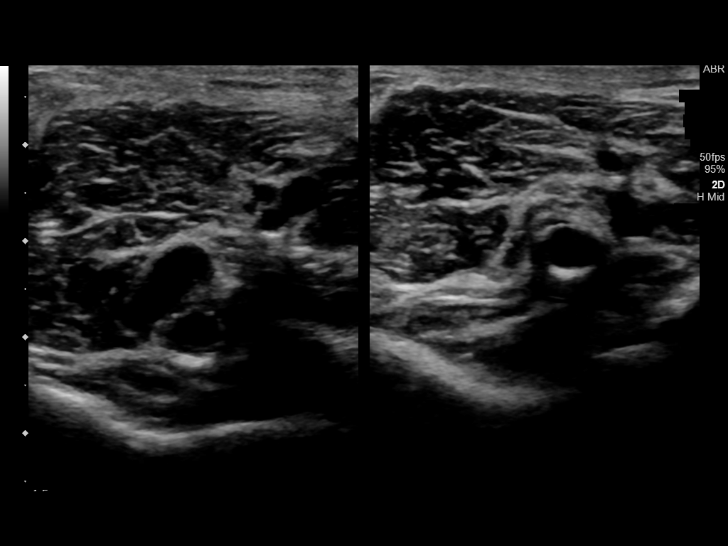
[im 31/34]
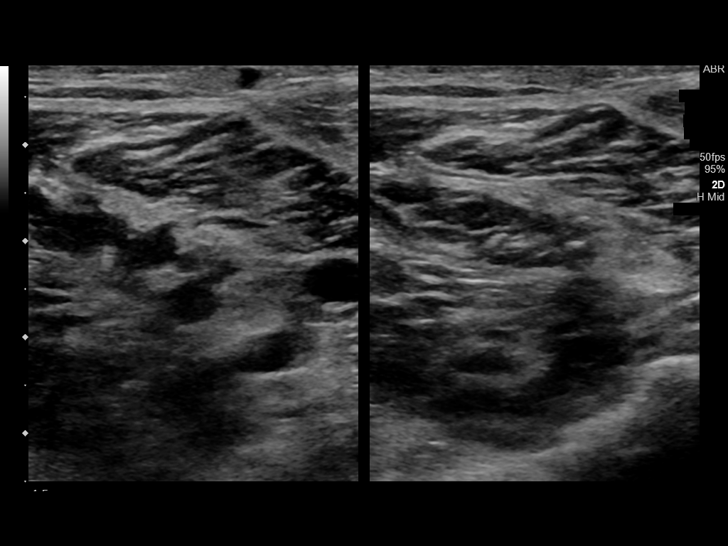
[im 34/34]
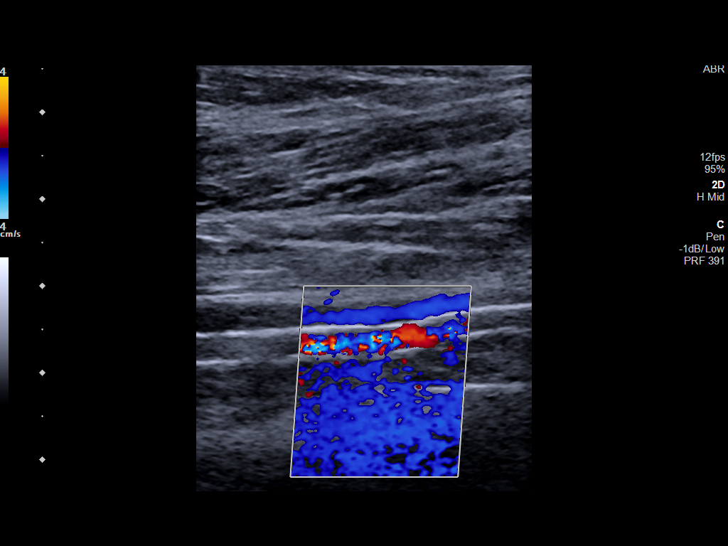

[13 of 24 positions shown; findings below may reference images not displayed]

FINDINGS: Contralateral Common Femoral Vein: Respiratory phasicity is normal
and symmetric with the symptomatic side. No evidence of thrombus.
Normal compressibility.

Common Femoral Vein: No evidence of thrombus. Normal
compressibility, respiratory phasicity and response to augmentation.

Saphenofemoral Junction: No evidence of thrombus. Normal
compressibility and flow on color Doppler imaging.

Profunda Femoral Vein: No evidence of thrombus. Normal
compressibility and flow on color Doppler imaging.

Femoral Vein: No evidence of thrombus. Normal compressibility,
respiratory phasicity and response to augmentation.

Popliteal Vein: No evidence of thrombus. Normal compressibility,
respiratory phasicity and response to augmentation.

Calf Veins: No evidence of thrombus. Normal compressibility and flow
on color Doppler imaging.

Superficial Great Saphenous Vein: No evidence of thrombus. Normal
compressibility.

Venous Reflux:  None.

Other Findings:  None.
IMPRESSION: No evidence of deep venous thrombosis.
